# Patient Record
Sex: Female | Born: 1960 | Race: White | Hispanic: No | Marital: Married | Smoking: Former smoker
Health system: Southern US, Community
[De-identification: ages and names within clinical notes are randomized; demographics above are authoritative.]

## PROBLEM LIST (undated history)

## (undated) DIAGNOSIS — C50919 Malignant neoplasm of unspecified site of unspecified female breast: Secondary | ICD-10-CM

## (undated) DIAGNOSIS — H9390 Unspecified disorder of ear, unspecified ear: Secondary | ICD-10-CM

## (undated) DIAGNOSIS — I10 Essential (primary) hypertension: Secondary | ICD-10-CM

## (undated) DIAGNOSIS — Z923 Personal history of irradiation: Secondary | ICD-10-CM

## (undated) DIAGNOSIS — C801 Malignant (primary) neoplasm, unspecified: Secondary | ICD-10-CM

## (undated) HISTORY — DX: Unspecified disorder of ear, unspecified ear: H93.90

## (undated) HISTORY — PX: WISDOM TOOTH EXTRACTION: SHX21

## (undated) HISTORY — PX: BREAST CYST EXCISION: SHX579

## (undated) HISTORY — DX: Essential (primary) hypertension: I10

## (undated) HISTORY — PX: OTHER SURGICAL HISTORY: SHX169

## (undated) HISTORY — PX: BREAST BIOPSY: SHX20

---
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
## 1997-08-19 ENCOUNTER — Ambulatory Visit (HOSPITAL_COMMUNITY): Admission: RE | Admit: 1997-08-19 | Discharge: 1997-08-19 | Payer: Self-pay | Admitting: Obstetrics and Gynecology

## 1997-11-04 ENCOUNTER — Inpatient Hospital Stay (HOSPITAL_COMMUNITY): Admission: RE | Admit: 1997-11-04 | Discharge: 1997-11-04 | Payer: Self-pay | Admitting: Obstetrics and Gynecology

## 1998-01-05 ENCOUNTER — Inpatient Hospital Stay (HOSPITAL_COMMUNITY): Admission: AD | Admit: 1998-01-05 | Discharge: 1998-01-06 | Payer: Self-pay | Admitting: Obstetrics and Gynecology

## 1999-10-03 ENCOUNTER — Encounter: Payer: Self-pay | Admitting: Emergency Medicine

## 1999-10-03 ENCOUNTER — Emergency Department (HOSPITAL_COMMUNITY): Admission: EM | Admit: 1999-10-03 | Discharge: 1999-10-03 | Payer: Self-pay | Admitting: Emergency Medicine

## 1999-10-10 ENCOUNTER — Encounter: Payer: Self-pay | Admitting: Neurosurgery

## 1999-10-10 ENCOUNTER — Ambulatory Visit (HOSPITAL_COMMUNITY): Admission: RE | Admit: 1999-10-10 | Discharge: 1999-10-11 | Payer: Self-pay | Admitting: Neurosurgery

## 2000-02-26 ENCOUNTER — Other Ambulatory Visit: Admission: RE | Admit: 2000-02-26 | Discharge: 2000-02-26 | Payer: Self-pay | Admitting: Obstetrics and Gynecology

## 2004-07-19 ENCOUNTER — Other Ambulatory Visit: Admission: RE | Admit: 2004-07-19 | Discharge: 2004-07-19 | Payer: Self-pay | Admitting: Internal Medicine

## 2005-06-04 ENCOUNTER — Ambulatory Visit (HOSPITAL_COMMUNITY): Admission: RE | Admit: 2005-06-04 | Discharge: 2005-06-04 | Payer: Self-pay | Admitting: Internal Medicine

## 2005-06-17 ENCOUNTER — Encounter: Admission: RE | Admit: 2005-06-17 | Discharge: 2005-06-17 | Payer: Self-pay | Admitting: Internal Medicine

## 2005-08-22 ENCOUNTER — Other Ambulatory Visit: Admission: RE | Admit: 2005-08-22 | Discharge: 2005-08-22 | Payer: Self-pay | Admitting: Internal Medicine

## 2006-06-24 ENCOUNTER — Ambulatory Visit (HOSPITAL_COMMUNITY): Admission: RE | Admit: 2006-06-24 | Discharge: 2006-06-24 | Payer: Self-pay | Admitting: *Deleted

## 2006-09-03 ENCOUNTER — Other Ambulatory Visit: Admission: RE | Admit: 2006-09-03 | Discharge: 2006-09-03 | Payer: Self-pay | Admitting: Family Medicine

## 2007-08-05 ENCOUNTER — Ambulatory Visit (HOSPITAL_COMMUNITY): Admission: RE | Admit: 2007-08-05 | Discharge: 2007-08-05 | Payer: Self-pay | Admitting: *Deleted

## 2007-08-11 ENCOUNTER — Encounter: Admission: RE | Admit: 2007-08-11 | Discharge: 2007-08-11 | Payer: Self-pay | Admitting: *Deleted

## 2008-02-29 HISTORY — PX: OTHER SURGICAL HISTORY: SHX169

## 2008-03-01 ENCOUNTER — Ambulatory Visit (HOSPITAL_COMMUNITY): Admission: RE | Admit: 2008-03-01 | Discharge: 2008-03-01 | Payer: Self-pay | Admitting: Neurosurgery

## 2008-03-17 ENCOUNTER — Ambulatory Visit (HOSPITAL_BASED_OUTPATIENT_CLINIC_OR_DEPARTMENT_OTHER): Admission: RE | Admit: 2008-03-17 | Discharge: 2008-03-17 | Payer: Self-pay | Admitting: Orthopedic Surgery

## 2008-08-11 ENCOUNTER — Encounter: Admission: RE | Admit: 2008-08-11 | Discharge: 2008-08-11 | Payer: Self-pay | Admitting: Family Medicine

## 2008-11-29 ENCOUNTER — Encounter: Payer: Self-pay | Admitting: Family Medicine

## 2008-11-29 LAB — CONVERTED CEMR LAB
Cholesterol: 200 mg/dL
HDL: 63 mg/dL
LDL Cholesterol: 112 mg/dL

## 2008-12-29 ENCOUNTER — Ambulatory Visit: Payer: Self-pay | Admitting: Family Medicine

## 2008-12-29 ENCOUNTER — Other Ambulatory Visit: Admission: RE | Admit: 2008-12-29 | Discharge: 2008-12-29 | Payer: Self-pay | Admitting: Family Medicine

## 2008-12-30 ENCOUNTER — Encounter: Payer: Self-pay | Admitting: Family Medicine

## 2008-12-30 LAB — CONVERTED CEMR LAB
AST: 21 units/L (ref 0–37)
Albumin: 4.5 g/dL (ref 3.5–5.2)
BUN: 13 mg/dL (ref 6–23)
Calcium: 8.5 mg/dL (ref 8.4–10.5)
Chloride: 102 meq/L (ref 96–112)
Glucose, Bld: 97 mg/dL (ref 70–99)
Potassium: 4.2 meq/L (ref 3.5–5.3)
Sodium: 138 meq/L (ref 135–145)
Total Protein: 6.9 g/dL (ref 6.0–8.3)

## 2009-01-05 ENCOUNTER — Encounter: Payer: Self-pay | Admitting: Family Medicine

## 2009-01-05 ENCOUNTER — Telehealth: Payer: Self-pay | Admitting: Family Medicine

## 2009-01-05 LAB — CONVERTED CEMR LAB: Pap Smear: NORMAL

## 2009-12-10 LAB — LIPID PANEL: HDL: 58 mg/dL (ref 35–70)

## 2010-02-19 ENCOUNTER — Encounter: Payer: Self-pay | Admitting: *Deleted

## 2010-04-19 ENCOUNTER — Encounter: Payer: Self-pay | Admitting: Family Medicine

## 2010-04-26 ENCOUNTER — Ambulatory Visit (INDEPENDENT_AMBULATORY_CARE_PROVIDER_SITE_OTHER): Payer: 59 | Admitting: Family Medicine

## 2010-04-26 VITALS — BP 159/90 | HR 65 | Ht 61.5 in | Wt 139.0 lb

## 2010-04-26 DIAGNOSIS — L089 Local infection of the skin and subcutaneous tissue, unspecified: Secondary | ICD-10-CM

## 2010-04-26 DIAGNOSIS — L723 Sebaceous cyst: Secondary | ICD-10-CM

## 2010-04-26 NOTE — Patient Instructions (Signed)
Call if any signs of redenss Follow up with your dermatologist.  Remove bandage tomorrow and clean in the shower.  Apply vaseline and bandaid until heals.

## 2010-04-26 NOTE — Progress Notes (Signed)
  Subjective:    Patient ID: Catherine Monroe, female    DOB: 1960-03-19, 50 y.o.   MRN: [id number]  HPI Cyst there for a long time. Was getting a massage a couple of weeks ago and then it broke and some discharge. .  Then when went home and tried to squeeze it some.  No fever. It is now tender.    Review of Systems     Objective:   Physical Exam  Constitutional: She appears well-nourished.  Skin:          Some bruising over the area.  + yellow discharge.            Assessment & Plan:  Infected seb cyst. - Lesion lanced and large amount of debris expressed.  Discussed she will need full excision of the seb cyst.  She will call her dermatologist for this.  Hold off on ABX since no surrounding cellulitis.

## 2010-04-30 ENCOUNTER — Encounter: Payer: Self-pay | Admitting: Family Medicine

## 2010-05-15 LAB — POCT HEMOGLOBIN-HEMACUE: Hemoglobin: 13.2 g/dL (ref 12.0–15.0)

## 2010-06-07 ENCOUNTER — Encounter: Payer: Self-pay | Admitting: Family Medicine

## 2010-06-12 NOTE — Op Note (Signed)
Catherine Monroe, LEHRMAN                 ACCOUNT NO.:  [id number]   MEDICAL RECORD NO.:  [id number]          PATIENT TYPE:  AMB   LOCATION:  DSC                          FACILITY:  [facility]   PHYSICIAN:  Robert A. Thurston Hole, M.D. DATE OF BIRTH:  03-Mar-1960   DATE OF PROCEDURE:  03/17/2008  DATE OF DISCHARGE:                               OPERATIVE REPORT   PREOPERATIVE DIAGNOSIS:  Right knee lateral meniscus tear.   POSTOPERATIVE DIAGNOSIS:  Right knee lateral meniscus tear.   PROCEDURE:  Right knee examination under anesthesia followed by  arthroscopic partial lateral meniscectomy.   SURGEON:  Elana Alm. Thurston Hole, MD   ANESTHESIA:  General.   OPERATIVE TIME:  30 minutes.   COMPLICATIONS:  None.   INDICATIONS FOR PROCEDURE:  Ms. Loeffler is a 50 year old woman who has had  6 months of increasing right knee pain with exam under MRI documenting  lateral meniscus tear.  She has failed conservative care and is now to  undergo arthroscopy.   DESCRIPTION:  Ms. Fogleman was brought to the operating room on March 17, 2008, and placed on operative table in supine position.  After  adequate level of general anesthesia was obtained, her right knee was  examined.  She had full range of motion of knee, stable ligamentous exam  with normal patellar tracking.  The knee was sterilely injected with  0.25% Marcaine with epinephrine.  The right leg was prepped using  sterile DuraPrep and draped using sterile technique.  Originally through  an anterolateral portal, the arthroscope with a pump was attached was  placed and through an anteromedial portal, an arthroscopic probe was  placed.  On initial inspection of medial compartment, the articular  cartilage was normal.  Medial meniscus was normal.  Intercondylar notch  was inspected.  Anterior and posterior cruciate ligaments were normal.  Lateral compartment articular cartilage was normal.  Lateral meniscus  showed a split tear of the posterolateral corner  30-40%, which was  resected back to a stable rim.  Patellofemoral joint articular cartilage  was normal.  The patella tracked normally.  Medial and lateral gutters  showed moderate synovitis, which was debrided, otherwise this was free  of pathology.  After this was done, it was felt that all pathology has  been satisfactorily addressed.  The instruments were removed.  Portals  were closed with 3-0 nylon suture and injected with 0.25% Marcaine with  epinephrine and 4 mg morphine.  Knee was then dressed sterilely.  The  patient was then awakened, extubated, and taken to recovery in stable  condition.  Needle and sponge counts were correct x2 at end of the case.   FOLLOWUP CARE:  Ms. Mazzarella will be followed as an outpatient on Vicodin  and Mobic.  She will be seen back in the office in a week for sutures  out and followup.      Robert A. Thurston Hole, M.D.  Electronically Signed     RAW/MEDQ  D:  03/17/2008  T:  03/18/2008  Job:  [id number]

## 2010-06-15 NOTE — H&P (Signed)
[place]  Patient:    Catherine Monroe, Catherine Monroe                        MRN: [id number] Adm. Date:  [id number] Attending:  Josie Monroe                         History and Physical  REASON FOR ADMISSION:  Herniated cervical disc.  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  She presented there on the 5th of September with severe left arm pain and weakness and they performed a MRI of her cervical spine which demonstrated a C6/7 lateral disc herniation.  She has a smaller central disc herniation and spondylitic disease at C4/5 and to a lesser degree at the C5/6 level.  She says that she has had episodic left-sided neck and arm pain but that on the 3rd of September she awoke with severe left arm pain and numbness which has been increasingly problematic for her.  She notes weakness in her left arm and she notes that she is losing control of her left arm.  She notes a little numbness in her left hand.  She denies any right upper extremity complaints or any bowel or bladder dysfunction or any lower extremity complaints.  Catherine Monroe works out regularly.  She lifts weight and she said that when she was lifting weights about a month ago she developed pain into her left arm and subsequently she has had intermittent left arm and neck pain.  She works on a float basis as a Engineer, civil (consulting).  She has two children, age 45 years and 21 months.  REVIEW OF SYSTEMS:  She was reviewed as a patient.  Pertinent positives that are under EAR/NOSE/MOUTH/THROAT:  She notes chronic hearing loss. MUSCULOSKELETAL:  She notes are weakness, back pain, arm pain, and neck pain. All other systems are negative.  PAST MEDICAL HISTORY:  Prior operations and hospitalization are significant for in 1972 she had a radical mastoidectomy in the right ear.  In 1980,  she had a benign cyst removed from her left breast.  In 1980, she had a pilonidal cyst removed.  She has no other medical problems.  MEDICATIONS:  Naprosyn q.12h. for neck pain.  ALLERGIES:  She is allergic to PENICILLIN.  HEIGHT/WEIGHT:  She is 5 feet 1 inch tall and 121 pounds.  FAMILY HISTORY:  Her mother is 104 and in good health.  Father is 92 in good health.  There is a history of hypertension in her family.  SOCIAL HISTORY:  Catherine Monroe is a nonsmoker, nondrinker.  No history of substance abuse.  She has had no recent weight gain or loss.  She is married. She has two children at home and she works as a Engineer, civil (consulting).  LABORATORY DATA:  Diagnostic studies as above.  PHYSICAL EXAMINATION:  GENERAL:  On examination today, Catherine Monroe is a very uncomfortable-appearing white female in no acute distress.  HEENT:  Normocephalic and atraumatic.  Pupils are equal, round and reactive to light.  Extraocular muscles are intact.  Sclerae white.  Conjunctivae pink. Oropharynx benign.  Uvula midline.  NECK:  There are no masses, meningismus, deformity, tracheal deviation, jugular venous distension, or carotid bruits.  She is limited.  MUSCULOSKELETAL:  Cervical range of motion is significantly positive Spurlings  maneuver to the left with reproduction of radicular pain turning her head to the left and negative on the right.  With axial compression, she notes shooting pain to her left arm but otherwise negative.  She has limited range of motion both in forward bending and has reproduction of radicular pain with extension.  LUNGS:  There is normal respiratory effort with good intercostal function. Lungs are clear to auscultation.  There are no rales, rhonchi, or wheezes.  CARDIOVASCULAR:  Regular rate and rhythm to auscultation.  No murmurs are appreciated.  EXTREMITIES:  There is no extremity edema, clubbing, or cyanosis.  There are palpable pedal pulses.  ABDOMEN:  Soft, nontender.  No  hepatosplenomegaly appreciated or masses. There are active bowel sounds.  No guarding or rebound.  NEUROLOGICAL:  The patient is able to walk about the examining room with a normal heel, toe, and casual gait.  The patient is oriented to time, person, and place.  She has good recall of both recent and remote memory with normal attention span and concentration.  The patient speaks a clear and fluent speech and exhibits normal language function and appropriate fund of knowledge.  Cranial nerve examination shows the pupils are equal, round and reactive to light.  Extraocular movements are full.  Visual fields are full to confrontational testing.  Facial sensation and facial motor are intact and symmetric.  Hearing is intact to finger rub.  Palate is upgoing.  Shoulder shrug is symmetric.  Tongue protrudes in the midline.  Motor examination shows the motor strength is 5/5 and bilateral deltoids, biceps, triceps, hand grips, wrist extensor, interossei with the exception of 3+/4- out of 5 left triceps strength and 4-/5 left wrist flexion strength.  In the lower extremities, motor strength is 5/5 and bilateral.  Hip flexion and extension, quadriceps, hamstrings, plantar flexion, dorsi flexion, and extensor hallucis longus. Sensory examination shows she has mild hypesthesia to pinprick in the C7 distribution on the left.  Deep tendon reflexes are 2 in the upper extremities, in the biceps, and the brachial radialis bilaterally symmetric. They are 2 at the right triceps.  Trace at the left triceps.  Reflexes are 2 at the knees, 2 at the ankles.  Great toes are downgoing to plantar stimulation.  Cerebellar examination is normal.  Coordination in the upper and lower extremities is normal to rapid alternating movement.  Romberg test is negative.  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 She  has spondylitic disease and a central disc protrusion at C4/5 and to a lesser degree at the C5/6 level.  RECOMMENDATIONS:  I have recommended to her based on the severity of her pain  as well as significant weakness that she undergo a cervical discectomy.  I told her I felt this could be done as either an anterior cervical effusion or preferably because of the lateral location of the herniated disc that this could be done as a posterior discectomy.  I have recommended that this be done using the metric system so that this will facilitate minimal exposure.  This will be done with fluoroscopy.  I explained to her the risks of surgery including the possibility of venous embolism and the need for central catheter during the surgery.  She understands the risks which include bleeding, infection, damage to nerves and vessels, weakness, paralysis, recurrent disc herniation, need for further surgery at a later date, failure  to relieve all of her pain or weakness, or persistent arm pain, numbness, or reflexic sympathetic disc disease.  She wishes to go ahead with surgery.  This is set up for October 10, 1999. DD:  10/10/99 TD:  10/10/99 Job: [id number]

## 2010-06-15 NOTE — Op Note (Signed)
[place]. [facility]  Patient:    Catherine, Monroe                        MRN: [id number] Proc. Date: 10/10/99 Adm. Date:  [id number] Disc. 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  ADDITIONAL DIAGNOSES:  Multilevel cervical spondylosis, central disk herniations at the C4-5 and C5-6 level.  POSTOPERATIVE DIAGNOSIS:  Herniated cervical disk C6-7, left with left C7 radiculopathy.  ADDITIONAL DIAGNOSES:  Multilevel cervical spondylosis, central disk herniations at the C4-5 and C5-6 level.  OPERATION PERFORMED:  Left C6-7 microendoscopic diskectomy.  SURGEON:  Danae Orleans. Venetia Maxon, M.D.  ASSISTANT:  Stefani Dama, M.D.  ANESTHESIA:  General endotracheal.  ESTIMATED BLOOD LOSS:  Minimal.  COMPLICATIONS:  None.  DISPOSITION:  Recovery.  INDICATIONS FOR PROCEDURE:  Catherine Monroe is a 50 year old woman with an acute C7 radiculopathy on the left with severe left triceps weakness.  She was found to have a laterally based disk herniation at the C6-7 level compressing the left C-7 nerve root.  She was also found to have cervical spondylitic disease and central disk herniations at the C4-5 and C5-6 levels.  Consequently it was elected to perform a posterior single level microendoscopic diskectomy rather than multilevel anterior cervical diskectomy.  DESCRIPTION OF PROCEDURE:  Catherine Monroe was brought to the operating room. Following the placement of the central venous catheter and smooth and uncomplicated induction of general endotracheal anesthesia, she was placed in a supine position.  She was placed into three-pin Mayfield head fixation.  She was placed in a sitting position.  Her fluoroscopy was utilized throughout the surgery to confirm localization and positioning of instrumentation.  The posterior neck was then prepped and  draped in the usual sterile fashion.  The area of planned incision was infiltrated with 0.25% Marcaine and 0.5% lidocaine 1:200,000 epinephrine.  Incision was made overlying the radiographically confirmed postioning of a K-wire just a fingerbreadth lateral on the left of the C6-7 interspace.  The K-wire was inserted to contact the C6 lamina.  The sequential dilators were then used after enlarging the incision to approximately 2 cm in length and using a #4 endoscope tube, the self-retaining retractor system was hooked up.  Positioning of the endoscope tube was confirmed under fluoroscopic visualization.  The ____________ system was used to facilitate exposure and endoscopic diskectomy.  The microscope was then brought into the field and using microdissection technique, the muscle overlying the C6-7 laminar interspace was cauterized and removed with pituitary rongeur.  The Midas Rex drill with A-2 bur was then utilized to perform a small semihemilaminectomy of C6 and this was completed with 2 mm gold tipped Kerrison rongeur.  The superior portion of the C7 lamina was also removed with Kerrison rongeur.  The ligamentum flavum was detached from the dura and the ligamentum flavum was detached from the dura and the ligamentum flavum was then removed in piecemeal fashion exposing the lateral edge of the spinal cord and epidural fat.  A large disk herniation was identified.  The C7 nerve root appeared to be pushed cephalad by the herniated disk material.  The disk herniation was then incised with a 15 blade and disk material was removed in a piecemeal  fashion.  This resulted in decompression of the C7 nerve root and lateral aspect of the spinal cord.  Hemostasis was assured with Gelfoam soaked in thrombin.  Final x-ray confirmed positioning at the appropriate level within the disk space.  The endoscope was removed.  The microscope was taken out of the field.  The wound was copiously irrigated with  bacitracin saline.  The deep muscle layer was reapproximated with a single 2-0 Vicryl stitch and the skin edges reapproximated with interrupted 3-0 Vicryl subcuticular stitch.  The wound was dressed with benzoin and Steri-Strips, Telfa and Tegaderm.  The patient was removed from three-pin fixation, extubated in the operating room, taken to  recovery room in stable and satisfactory condition having tolerated the operation well.  Counts were correct at the end of the case. DD:  10/10/99 TD:  10/11/99 Job: [id number]

## 2011-03-13 ENCOUNTER — Encounter: Payer: Self-pay | Admitting: *Deleted

## 2011-03-20 ENCOUNTER — Other Ambulatory Visit (HOSPITAL_COMMUNITY)
Admission: RE | Admit: 2011-03-20 | Discharge: 2011-03-20 | Disposition: A | Discharge status: Home / Self Care | Payer: 59 | Source: Ambulatory Visit | Attending: Family Medicine | Admitting: Family Medicine

## 2011-03-20 ENCOUNTER — Encounter: Payer: Self-pay | Admitting: Family Medicine

## 2011-03-20 ENCOUNTER — Ambulatory Visit (INDEPENDENT_AMBULATORY_CARE_PROVIDER_SITE_OTHER): Payer: 59 | Admitting: Family Medicine

## 2011-03-20 VITALS — BP 141/77 | HR 102 | Ht 61.5 in | Wt 130.0 lb

## 2011-03-20 DIAGNOSIS — Z01419 Encounter for gynecological examination (general) (routine) without abnormal findings: Secondary | ICD-10-CM

## 2011-03-20 DIAGNOSIS — Z23 Encounter for immunization: Secondary | ICD-10-CM

## 2011-03-20 NOTE — Patient Instructions (Signed)
Start a regular exercise program and make sure you are eating a healthy diet Try to eat 4 servings of dairy a day or take a calcium supplement (500mg twice a day). Your vaccines are up to date.   

## 2011-03-20 NOTE — Progress Notes (Signed)
  Subjective:     Catherine Monroe is a 51 y.o. female and is here for a comprehensive physical exam. The patient reports problems - Not regular periods.  Skipped last period. Marland Kitchen  History   Social History  . Marital Status: Married    Spouse Name: N/A    Number of Children: 2  . Years of Education: N/A   Occupational History  . Not on file.   Social History Main Topics  . Smoking status: Former Smoker    Types: Cigarettes    Quit date: 01/29/1987  . Smokeless tobacco: Not on file  . Alcohol Use: Yes  . Drug Use: No  . Sexually Active: Yes -- Female partner(s)   Other Topics Concern  . Not on file   Social History Narrative   REgular exercise.    Health Maintenance  Topic Date Due  . Influenza Vaccine  10/29/2011  . Pap Smear  03/19/2014  . Tetanus/tdap  03/19/2021    The following portions of the patient's history were reviewed and updated as appropriate: allergies, current medications, past family history, past medical history, past social history, past surgical history and problem list.  Review of Systems A comprehensive review of systems was negative.   Objective:    BP 141/77  Pulse 102  Ht 5' 1.5" (1.562 m)  Wt 130 lb (58.968 kg)  BMI 24.17 kg/m2  LMP 02/23/2011 General appearance: alert, cooperative and appears stated age Head: Normocephalic, without obvious abnormality, atraumatic Eyes: PERRLA, EOMi.conjunctiva clear.   Ears: normal TM's and external ear canals both ears Nose: Nares normal. Septum midline. Mucosa normal. No drainage or sinus tenderness. Throat: lips, mucosa, and tongue normal; teeth and gums normal Neck: no adenopathy, no carotid bruit, no JVD, supple, symmetrical, trachea midline and thyroid not enlarged, symmetric, no tenderness/mass/nodules Back: symmetric, no curvature. ROM normal. No CVA tenderness. Lungs: clear to auscultation bilaterally Breasts: normal appearance, no masses or tenderness Heart: regular rate and rhythm, S1, S2 normal,  no murmur, click, rub or gallop Abdomen: soft, non-tender; bowel sounds normal; no masses,  no organomegaly Pelvic: cervix normal in appearance, external genitalia normal, no adnexal masses or tenderness, no cervical motion tenderness, rectovaginal septum normal, uterus normal size, shape, and consistency and vagina normal without discharge Extremities: extremities normal, atraumatic, no cyanosis or edema Pulses: 2+ and symmetric Skin: Skin color, texture, turgor normal. No rashes or lesions Lymph nodes: Cervical, supraclavicular, and axillary nodes normal. Neurologic: Grossly normal    Assessment:    Healthy female exam. CPE annual exam     Plan:     See After Visit Summary for Counseling Recommendations  Start a regular exercise program and make sure you are eating a healthy diet Try to eat 4 servings of dairy a day or take a calcium supplement (500mg  twice a day). Your vaccines are up to date.  Given tdap today.   Fasting labs slip given today.  Call if periods more irregular Had discussion about mammo.  She wants to hold off until age 21.  She has really done a great job with her weight loss and her blood pressure is actually improved significantly today.

## 2011-03-21 LAB — COMPLETE METABOLIC PANEL WITH GFR
ALT: 20 U/L (ref 0–35)
AST: 19 U/L (ref 0–37)
Alkaline Phosphatase: 52 U/L (ref 39–117)
BUN: 14 mg/dL (ref 6–23)
Glucose, Bld: 90 mg/dL (ref 70–99)
Potassium: 4.2 mEq/L (ref 3.5–5.3)
Total Bilirubin: 1 mg/dL (ref 0.3–1.2)
Total Protein: 6.4 g/dL (ref 6.0–8.3)

## 2011-03-21 LAB — LIPID PANEL
LDL Cholesterol: 79 mg/dL (ref 0–99)
Triglycerides: 79 mg/dL (ref <150)

## 2011-03-21 NOTE — Progress Notes (Signed)
Quick Note:  All labs are normal. ______ 

## 2011-06-27 ENCOUNTER — Emergency Department
Admission: EM | Admit: 2011-06-27 | Discharge: 2011-06-27 | Disposition: A | Discharge status: Home / Self Care | Payer: 59 | Source: Home / Self Care | Attending: Emergency Medicine | Admitting: Emergency Medicine

## 2011-06-27 ENCOUNTER — Encounter: Payer: Self-pay | Admitting: Emergency Medicine

## 2011-06-27 ENCOUNTER — Emergency Department: Admit: 2011-06-27 | Discharge: 2011-06-27 | Disposition: A | Discharge status: Home / Self Care | Payer: 59

## 2011-06-27 DIAGNOSIS — M79609 Pain in unspecified limb: Secondary | ICD-10-CM

## 2011-06-27 DIAGNOSIS — M79672 Pain in left foot: Secondary | ICD-10-CM

## 2011-06-27 NOTE — ED Notes (Signed)
Right foot injury 10 days ago reinjured today

## 2011-06-27 NOTE — ED Provider Notes (Signed)
History     CSN: [id number]  Arrival date & time 06/27/11  1637   First MD Initiated Contact with Patient 06/27/11 1706      Chief Complaint  Patient presents with  . Foot Injury    (Consider location/radiation/quality/duration/timing/severity/associated sxs/prior treatment) HPI She injured her foot about 10 days ago with an inversion injury.  It was painful the time and then started to get better.  She was going down the stairs today and slipped and re-nverted her left ankle and reinjured it.  She states there is some swelling and bruising.  She is mostly tender on the outside of her foot but not her ankle.  No previous injuries other than last week and no history of any fractures.  She states the pain is sore and sharp constant, worse with certain movements and with walking.  She has not been using any medications or modalities.  Past Medical History  Diagnosis Date  . 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 Right knee scope for torn meniscus 2/10  . Mastoidectomy 1970    Family History  Problem Relation Age of Onset  . Hypertension Mother   . Hypertension Father   . Stroke Father   . Prostate cancer Father     History  Substance Use Topics  . Smoking status: Former Smoker    Types: Cigarettes    Quit date: 01/29/1987  . Smokeless tobacco: Not on file  . Alcohol Use: Yes    OB History    Grav Para Term Preterm Abortions TAB SAB Ect Mult Living                  Review of Systems  All other systems reviewed and are negative.    Allergies  Penicillins  Home Medications   Current Outpatient Rx  Name Route Sig Dispense Refill  . IBUPROFEN 200 MG PO TABS Oral Take 200 mg by mouth daily as needed. For knee pain     . MAGNESIUM 100 MG PO CAPS Oral Take by mouth.      . MELATONIN 2.5 MG PO CAPS Oral Take by mouth at  bedtime.        BP 135/84  Pulse 74  Temp(Src) 98.2 F (36.8 C) (Oral)  Resp 12  Ht 5' 1.5" (1.562 m)  Wt 133 lb (60.328 kg)  BMI 24.72 kg/m2  SpO2 97%  Physical Exam  Nursing note and vitals reviewed. Constitutional: She is oriented to person, place, and time. She appears well-developed and well-nourished.  HENT:  Head: Normocephalic and atraumatic.  Eyes: No scleral icterus.  Neck: Neck supple.  Cardiovascular: Regular rhythm and normal heart sounds.   Pulmonary/Chest: Effort normal and breath sounds normal. No respiratory distress.  Musculoskeletal:       L ankle/foot: FROM, +TTP 4th and 5th metatarsals.   No TTP medial/lateral malleolus, navicular, calcaneus, Achilles, proximal fibula.  +ecchymoses distally in middle toes and mild swelling lateral foot.  Distal neurovascular status is intact.    Neurological: She is alert and oriented to person, place, and time.  Skin: Skin is warm and dry.  Psychiatric: She has a normal mood and affect. Her speech is normal.    ED Course  Procedures (including critical care time)  Labs Reviewed - No data to display Dg Foot Complete Left  06/27/2011  *RADIOLOGY REPORT*  Clinical Data: Left foot pain since a fall 10 days ago.  LEFT FOOT - COMPLETE 3+ VIEW  Comparison: None.  Findings: Elongated, oblique fracture of the fifth metatarsal shaft with associated mild soft tissue swelling.  No significant displacement or angulation.  IMPRESSION: Fifth metatarsal fracture, as described above.  Original Report Authenticated By: Darrol Angel, M.D.     1. Left foot pain       MDM  An x-ray was obtained and read by the radiologist as above. Encourage rest, ice, compression with ACE bandage and/or a brace, and elevation of injured body part.  The role of anti-inflammatories is discussed with the patient.  We gave her a walking boot.  Since she is not in significant pain, I believe that the walking boot can take pressure off of the bone enough  and that she should not need any crutches.  We have referred her to sports medicine for followup evaluation and followup x-ray.  Marlaine Hind, MD 06/27/11 1754

## 2011-07-11 ENCOUNTER — Encounter: Payer: Self-pay | Admitting: Family Medicine

## 2011-07-11 ENCOUNTER — Ambulatory Visit (INDEPENDENT_AMBULATORY_CARE_PROVIDER_SITE_OTHER): Payer: 59 | Admitting: Family Medicine

## 2011-07-11 ENCOUNTER — Ambulatory Visit (HOSPITAL_BASED_OUTPATIENT_CLINIC_OR_DEPARTMENT_OTHER)
Admission: RE | Admit: 2011-07-11 | Discharge: 2011-07-11 | Disposition: A | Discharge status: Home / Self Care | Payer: 59 | Source: Ambulatory Visit | Attending: Family Medicine | Admitting: Family Medicine

## 2011-07-11 VITALS — BP 136/88 | HR 67 | Temp 98.5°F | Ht 62.0 in | Wt 130.0 lb

## 2011-07-11 DIAGNOSIS — X58XXXA Exposure to other specified factors, initial encounter: Secondary | ICD-10-CM | POA: Insufficient documentation

## 2011-07-11 DIAGNOSIS — S92309A Fracture of unspecified metatarsal bone(s), unspecified foot, initial encounter for closed fracture: Secondary | ICD-10-CM

## 2011-07-11 DIAGNOSIS — S92355A Nondisplaced fracture of fifth metatarsal bone, left foot, initial encounter for closed fracture: Secondary | ICD-10-CM

## 2011-07-11 DIAGNOSIS — S92353A Displaced fracture of fifth metatarsal bone, unspecified foot, initial encounter for closed fracture: Secondary | ICD-10-CM

## 2011-07-11 NOTE — Progress Notes (Signed)
Subjective:    Patient ID: Catherine Monroe, female    DOB: July 24, 1960, 51 y.o.   MRN: [id number]  PCP: Dr. Linford Arnold  HPI 51 yo F here for left foot fracture.  Patient reports on 5/19 she was coming over a baby gate when she fell to her left side. Some pain in left foot though not excruciating - was achy and improving. Then a bug scared her on 5/30 - she jumped back, put all weight on left foot and felt severe pain in lateral left foot. Some swelling and bruising. Difficulty bearing weight after this. Went to urgent care that day - had x-rays showing a 5th metatarsal shaft fracture - placed in short cam walker and referred here. Hurts if she puts any weight on left foot especially without cam walker. Not taking any medications and has stopped icing. Used motrin the first night.   Past Medical History  Diagnosis Date  . 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 ibuprofen (ADVIL,MOTRIN) 200 MG tablet Take 200 mg by mouth daily as needed. For knee pain       . Magnesium 100 MG CAPS Take by mouth.        . Melatonin 2.5 MG CAPS Take by mouth at bedtime.          Past Surgical History  Procedure Date  . Right knee scope for torn meniscus 2/10  . Mastoidectomy 1970    Allergies  Allergen Reactions  . Penicillins     REACTION: hives and itching    History   Social History  . Marital Status: Married    Spouse Name: N/A    Number of Children: 2  . Years of Education: N/A   Occupational History  . Not on file.   Social History Main Topics  . Smoking status: Former Smoker    Types: Cigarettes    Quit date: 01/29/1987  . Smokeless tobacco: Not on file  . Alcohol Use: Yes  . Drug Use: No  . Sexually Active: Yes -- Female partner(s)   Other Topics Concern  . Not on file   Social History  Narrative   REgular exercise.     Family History  Problem Relation Age of Onset  . Hypertension Mother   . Hypertension Father   . Stroke Father   . Prostate cancer Father   . Sudden death Neg Hx   . Hyperlipidemia Neg Hx   . Heart attack Neg Hx   . Diabetes Neg Hx     BP 136/88  Pulse 67  Temp 98.5 F (36.9 C) (Oral)  Ht 5\' 2"  (1.575 m)  Wt 130 lb (58.968 kg)  BMI 23.78 kg/m2  Review of Systems See HPI above.    Objective:   Physical Exam Gen: NAD  L foot/ankle: Mild swelling but no bruising left foot laterally.  No other gross deformity, swelling, ecchymoses FROM ankle.  Mild pain with ext rotation. TTP 5th > 4th metatarsal.  No other TTP about foot or ankle. Negative ant drawer and talar tilt.   Negative syndesmotic compression. Thompsons test negative. NV intact distally.    Assessment & Plan:  1. Left oblique 5th metatarsal fracture - x-rays show questionable interval healing though fracture has not displaced further.  Advised patient that  in order to give this the best opportunity to heal with conservative care, recommend non-weight bearing given it's a shaft fracture of the 5th metatarsal.  Continue wearing the short cam walker for protection.  Elevation, icing, tylenol as needed.  Crutches to help with ambulation.  F/u in 2 weeks for reevaluation, repeat x-rays.

## 2011-07-11 NOTE — Patient Instructions (Addendum)
Your x-rays show possible early healing of this fracture. Continue wearing the short cam boot as much as possible. I would recommend not weight bearing with a fracture of this bone in this location to give you the best chance for healing. This means either crutches or a knee scooter. Elevate above the level of your heart when possible. Tylenol as needed for pain. Follow up with me in 2 weeks for reevaluation, repeat x-rays. Typically you're looking at 6-8 weeks of treatment before returning to regular activities assuming this heals as expected.

## 2011-07-13 ENCOUNTER — Encounter: Payer: Self-pay | Admitting: Family Medicine

## 2011-07-13 DIAGNOSIS — S92355A Nondisplaced fracture of fifth metatarsal bone, left foot, initial encounter for closed fracture: Secondary | ICD-10-CM | POA: Insufficient documentation

## 2011-07-13 NOTE — Assessment & Plan Note (Signed)
Left oblique 5th metatarsal fracture - x-rays show questionable interval healing though fracture has not displaced further.  Advised patient that in order to give this the best opportunity to heal with conservative care, recommend non-weight bearing given it's a shaft fracture of the 5th metatarsal.  Continue wearing the short cam walker for protection.  Elevation, icing, tylenol as needed.  Crutches to help with ambulation.  F/u in 2 weeks for reevaluation, repeat x-rays.

## 2011-07-25 ENCOUNTER — Encounter: Payer: Self-pay | Admitting: Family Medicine

## 2011-07-25 ENCOUNTER — Ambulatory Visit (HOSPITAL_BASED_OUTPATIENT_CLINIC_OR_DEPARTMENT_OTHER)
Admission: RE | Admit: 2011-07-25 | Discharge: 2011-07-25 | Disposition: A | Discharge status: Home / Self Care | Payer: 59 | Source: Ambulatory Visit | Attending: Family Medicine | Admitting: Family Medicine

## 2011-07-25 ENCOUNTER — Ambulatory Visit (INDEPENDENT_AMBULATORY_CARE_PROVIDER_SITE_OTHER): Payer: 59 | Admitting: Family Medicine

## 2011-07-25 VITALS — BP 133/90 | HR 81 | Temp 98.1°F | Ht 62.0 in | Wt 129.0 lb

## 2011-07-25 DIAGNOSIS — S92353A Displaced fracture of fifth metatarsal bone, unspecified foot, initial encounter for closed fracture: Secondary | ICD-10-CM

## 2011-07-25 DIAGNOSIS — X58XXXA Exposure to other specified factors, initial encounter: Secondary | ICD-10-CM | POA: Insufficient documentation

## 2011-07-25 DIAGNOSIS — S92355A Nondisplaced fracture of fifth metatarsal bone, left foot, initial encounter for closed fracture: Secondary | ICD-10-CM

## 2011-07-25 DIAGNOSIS — S92309A Fracture of unspecified metatarsal bone(s), unspecified foot, initial encounter for closed fracture: Secondary | ICD-10-CM

## 2011-07-25 NOTE — Assessment & Plan Note (Signed)
Left oblique 5th metatarsal fracture - x-rays appear similar to those 2 weeks ago but compared to initial radiographs appears to be healing without large callus formation.  No further displacement.  Clinically she has improved as well.  Advised to continue non-weight bearing and wearing cam walker.  Will f/u in 2 weeks for repeat radiographs.  Again discussed risks of nonunion with this type of fracture but would wait until she had gone 6 weeks non-weight bearing and then if not healing, refer to orthopedist to consider ORIF vs bone stimulator.

## 2011-07-25 NOTE — Progress Notes (Signed)
Subjective:    Patient ID: Catherine Monroe, female    DOB: 1960/02/12, 51 y.o.   MRN: [id number]  PCP: Dr. Linford Arnold  HPI  51 yo F here for f/u left foot fracture.  6/13: Patient reports on 5/19 she was coming over a baby gate when she fell to her left side. Some pain in left foot though not excruciating - was achy and improving. Then a bug scared her on 5/30 - she jumped back, put all weight on left foot and felt severe pain in lateral left foot. Some swelling and bruising. Difficulty bearing weight after this. Went to urgent care that day - had x-rays showing a 5th metatarsal shaft fracture - placed in short cam walker and referred here. Hurts if she puts any weight on left foot especially without cam walker. Not taking any medications and has stopped icing. Used motrin the first night.  6/27: Patient has been non-weight bearing since visit 2 weeks ago with crutches States pain has improved quite a bit. Using cam walker also. Sleeping in boot also. Not taking anything for pain. Elevating.  Past Medical History  Diagnosis Date  . 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 ibuprofen (ADVIL,MOTRIN) 200 MG tablet Take 200 mg by mouth daily as needed. For knee pain       . Magnesium 100 MG CAPS Take by mouth.        . Melatonin 2.5 MG CAPS Take by mouth at bedtime.          Past Surgical History  Procedure Date  . Right knee scope for torn meniscus 2/10  . Mastoidectomy 1970    Allergies  Allergen Reactions  . Penicillins     REACTION: hives and itching    History   Social History  . Marital Status: Married    Spouse Name: N/A    Number of Children: 2  . Years of Education: N/A   Occupational History  . Not on file.   Social History Main Topics  . Smoking status: Former Smoker      Types: Cigarettes    Quit date: 01/29/1987  . Smokeless tobacco: Not on file  . Alcohol Use: Yes  . Drug Use: No  . Sexually Active: Yes -- Female partner(s)   Other Topics Concern  . Not on file   Social History Narrative   REgular exercise.     Family History  Problem Relation Age of Onset  . Hypertension Mother   . Hypertension Father   . Stroke Father   . Prostate cancer Father   . Sudden death Neg Hx   . Hyperlipidemia Neg Hx   . Heart attack Neg Hx   . Diabetes Neg Hx     BP 133/90  Pulse 81  Temp 98.1 F (36.7 C) (Oral)  Ht 5\' 2"  (1.575 m)  Wt 129 lb (58.514 kg)  BMI 23.59 kg/m2  LMP 07/16/2011  Review of Systems  See HPI above.    Objective:   Physical Exam  Gen: NAD  L foot/ankle: Mild swelling but no bruising left foot laterally.  No other gross deformity, swelling, ecchymoses FROM ankle. Mild TTP 5th metatarsal.  No other TTP about foot or ankle. Negative ant drawer and talar tilt.   Negative syndesmotic  compression. Thompsons test negative. NV intact distally.    Assessment & Plan:  1. Left oblique 5th metatarsal fracture - x-rays appear similar to those 2 weeks ago but compared to initial radiographs appears to be healing without large callus formation.  No further displacement.  Clinically she has improved as well.  Advised to continue non-weight bearing and wearing cam walker.  Will f/u in 2 weeks for repeat radiographs.  Again discussed risks of nonunion with this type of fracture but would wait until she had gone 6 weeks non-weight bearing and then if not healing, refer to orthopedist to consider ORIF vs bone stimulator.

## 2011-08-08 ENCOUNTER — Ambulatory Visit (INDEPENDENT_AMBULATORY_CARE_PROVIDER_SITE_OTHER): Payer: 59 | Admitting: Family Medicine

## 2011-08-08 ENCOUNTER — Ambulatory Visit (HOSPITAL_BASED_OUTPATIENT_CLINIC_OR_DEPARTMENT_OTHER)
Admission: RE | Admit: 2011-08-08 | Discharge: 2011-08-08 | Disposition: A | Discharge status: Home / Self Care | Payer: 59 | Source: Ambulatory Visit | Attending: Family Medicine | Admitting: Family Medicine

## 2011-08-08 VITALS — BP 139/93 | HR 73 | Temp 98.1°F | Ht 62.0 in | Wt 130.0 lb

## 2011-08-08 DIAGNOSIS — S92309A Fracture of unspecified metatarsal bone(s), unspecified foot, initial encounter for closed fracture: Secondary | ICD-10-CM | POA: Insufficient documentation

## 2011-08-08 DIAGNOSIS — Z09 Encounter for follow-up examination after completed treatment for conditions other than malignant neoplasm: Secondary | ICD-10-CM | POA: Insufficient documentation

## 2011-08-08 DIAGNOSIS — S92355A Nondisplaced fracture of fifth metatarsal bone, left foot, initial encounter for closed fracture: Secondary | ICD-10-CM

## 2011-08-08 DIAGNOSIS — S92353A Displaced fracture of fifth metatarsal bone, unspecified foot, initial encounter for closed fracture: Secondary | ICD-10-CM

## 2011-08-08 DIAGNOSIS — M810 Age-related osteoporosis without current pathological fracture: Secondary | ICD-10-CM | POA: Insufficient documentation

## 2011-08-08 DIAGNOSIS — X58XXXA Exposure to other specified factors, initial encounter: Secondary | ICD-10-CM | POA: Insufficient documentation

## 2011-08-09 ENCOUNTER — Encounter: Payer: Self-pay | Admitting: Family Medicine

## 2011-08-09 NOTE — Assessment & Plan Note (Signed)
Left oblique 5th metatarsal fracture - 6 1/2 weeks out from fracture though only 4 weeks of non-weight bearing.  X-rays without evidence of healing.  Clinically she is improved but has not put weight on this to test it.  Discussed case with Dr. Dion Saucier - believes this fracture is likely to heal but can take 3-5 months for radiographic healing - first 6 weeks nonweightbearing followed by 4 weeks of 50% weight bearing - if she does not tolerate this (discussed with patient), would be happy to see her in office to discuss ORIF.  Does not technically qualify as nonunion at this point.  Will see patient back in 4 weeks for repeat exam, x-rays (she has improved clinically) unless she does not tolerate 50% weight bearing - advised her to call me if this is the case and will refer her to Dr. Dion Saucier to discuss ORIF.

## 2011-08-09 NOTE — Progress Notes (Signed)
Subjective:    Patient ID: Catherine Monroe, female    DOB: 26-Jun-1960, 51 y.o.   MRN: [id number]  PCP: Dr. Linford Arnold  HPI  51 yo F here for f/u left foot fracture.  6/13: Patient reports on 5/19 she was coming over a baby gate when she fell to her left side. Some pain in left foot though not excruciating - was achy and improving. Then a bug scared her on 5/30 - she jumped back, put all weight on left foot and felt severe pain in lateral left foot. Some swelling and bruising. Difficulty bearing weight after this. Went to urgent care that day - had x-rays showing a 5th metatarsal shaft fracture - placed in short cam walker and referred here. Hurts if she puts any weight on left foot especially without cam walker. Not taking any medications and has stopped icing. Used motrin the first night.  6/27: Patient has been non-weight bearing since visit 2 weeks ago with crutches States pain has improved quite a bit. Using cam walker also. Sleeping in boot also. Not taking anything for pain. Elevating.  7/11: Patient has been non weight bearing with crutches and using cam walker. Not taking any medicines for pain. Pain has improved though getting spasms in bottom of foot. Elevating as well.  Past Medical History  Diagnosis Date  . 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 ibuprofen (ADVIL,MOTRIN) 200 MG tablet Take 200 mg by mouth daily as needed. For knee pain       . Magnesium 100 MG CAPS Take by mouth.        . Melatonin 2.5 MG CAPS Take by mouth at bedtime.          Past Surgical History  Procedure Date  . Right knee scope for torn meniscus 2/10  . Mastoidectomy 1970    Allergies  Allergen Reactions  . Penicillins     REACTION: hives and itching    History   Social History  . Marital  Status: Married    Spouse Name: N/A    Number of Children: 2  . Years of Education: N/A   Occupational History  . Not on file.   Social History Main Topics  . Smoking status: Former Smoker    Types: Cigarettes    Quit date: 01/29/1987  . Smokeless tobacco: Not on file  . Alcohol Use: Yes  . Drug Use: No  . Sexually Active: Yes -- Female partner(s)   Other Topics Concern  . Not on file   Social History Narrative   REgular exercise.     Family History  Problem Relation Age of Onset  . Hypertension Mother   . Hypertension Father   . Stroke Father   . Prostate cancer Father   . Sudden death Neg Hx   . Hyperlipidemia Neg Hx   . Heart attack Neg Hx   . Diabetes Neg Hx     BP 139/93  Pulse 73  Temp 98.1 F (36.7 C) (Oral)  Ht 5\' 2"  (1.575 m)  Wt 130 lb (58.968 kg)  BMI 23.78 kg/m2  LMP 07/16/2011  Review of Systems  See HPI above.    Objective:   Physical Exam  Gen: NAD  L foot/ankle: Mild swelling but no bruising left foot  laterally.  No other gross deformity, swelling, ecchymoses FROM ankle. Minimal TTP 5th metatarsal.  No other TTP about foot or ankle. Negative ant drawer and talar tilt.   Negative syndesmotic compression. Thompsons test negative. NV intact distally.    Assessment & Plan:  1. Left oblique 5th metatarsal fracture - 6 1/2 weeks out from fracture though only 4 weeks of non-weight bearing.  X-rays without evidence of healing.  Clinically she is improved but has not put weight on this to test it.  Discussed case with Dr. Dion Saucier - believes this fracture is likely to heal but can take 3-5 months for radiographic healing - first 6 weeks nonweightbearing followed by 4 weeks of 50% weight bearing in cam with crutches - if she does not tolerate this (discussed with patient), would be happy to see her in office to discuss ORIF.  Does not technically qualify as nonunion at this point.  Will see patient back in 4 weeks for repeat exam, x-rays (she has  improved clinically) unless she does not tolerate 50% weight bearing - advised her to call me if this is the case and will refer her to Dr. Dion Saucier to discuss ORIF.

## 2011-09-05 ENCOUNTER — Encounter: Payer: Self-pay | Admitting: Family Medicine

## 2011-09-05 ENCOUNTER — Ambulatory Visit (INDEPENDENT_AMBULATORY_CARE_PROVIDER_SITE_OTHER): Payer: 59 | Admitting: Family Medicine

## 2011-09-05 ENCOUNTER — Ambulatory Visit (HOSPITAL_BASED_OUTPATIENT_CLINIC_OR_DEPARTMENT_OTHER)
Admission: RE | Admit: 2011-09-05 | Discharge: 2011-09-05 | Disposition: A | Discharge status: Home / Self Care | Payer: 59 | Source: Ambulatory Visit | Attending: Family Medicine | Admitting: Family Medicine

## 2011-09-05 VITALS — BP 153/94 | HR 72 | Ht 62.0 in | Wt 130.0 lb

## 2011-09-05 DIAGNOSIS — S99929A Unspecified injury of unspecified foot, initial encounter: Secondary | ICD-10-CM

## 2011-09-05 DIAGNOSIS — X58XXXA Exposure to other specified factors, initial encounter: Secondary | ICD-10-CM | POA: Insufficient documentation

## 2011-09-05 DIAGNOSIS — S99919A Unspecified injury of unspecified ankle, initial encounter: Secondary | ICD-10-CM

## 2011-09-05 DIAGNOSIS — S99922A Unspecified injury of left foot, initial encounter: Secondary | ICD-10-CM

## 2011-09-05 DIAGNOSIS — S92309A Fracture of unspecified metatarsal bone(s), unspecified foot, initial encounter for closed fracture: Secondary | ICD-10-CM

## 2011-09-05 DIAGNOSIS — S92355A Nondisplaced fracture of fifth metatarsal bone, left foot, initial encounter for closed fracture: Secondary | ICD-10-CM

## 2011-09-05 NOTE — Assessment & Plan Note (Signed)
2 1/2 months out now - went from nonweightbearing to 50% weight bearing past 4 weeks.  Radiographically not much change but clinically much improved.  To continue wearing cam walker with ambulation - ok to take off to ice, bathe, sleep, and can short distances at home (to bathroom) without it if not painful.  No weight bearing exercise - can try swimming, cycling with low resistance if these are not painful.  F/u in 1 month to reevaluate clinically.  Again, can be up to 5 months before radiographically this would appear healed - will follow clinical improvement and advance activities based on that.

## 2011-09-05 NOTE — Progress Notes (Signed)
Subjective:    Patient ID: Catherine Monroe, female    DOB: 1960-12-22, 51 y.o.   MRN: [id number]  PCP: Dr. Linford Arnold  HPI  51 yo F here for f/u left foot fracture.  6/13: Patient reports on 5/19 she was coming over a baby gate when she fell to her left side. Some pain in left foot though not excruciating - was achy and improving. Then a bug scared her on 5/30 - she jumped back, put all weight on left foot and felt severe pain in lateral left foot. Some swelling and bruising. Difficulty bearing weight after this. Went to urgent care that day - had x-rays showing a 5th metatarsal shaft fracture - placed in short cam walker and referred here. Hurts if she puts any weight on left foot especially without cam walker. Not taking any medications and has stopped icing. Used motrin the first night.  6/27: Patient has been non-weight bearing since visit 2 weeks ago with crutches States pain has improved quite a bit. Using cam walker also. Sleeping in boot also. Not taking anything for pain. Elevating.  7/11: Patient has been non weight bearing with crutches and using cam walker. Not taking any medicines for pain. Pain has improved though getting spasms in bottom of foot. Elevating as well.  8/8: Patient now reports she is about 90% improved. No longer with pain at fracture site but has pain anterior left ankle. Wearing cam walker when ambulatory - has been 50% weight bearing and just past couple days gone to full weight bearing without a crutch - no pain or issues. Not taking any medications for pain. No other complaints.  Past Medical History  Diagnosis Date  . 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 ibuprofen (ADVIL,MOTRIN) 200 MG tablet Take 200 mg by mouth daily as needed. For knee pain       .  Magnesium 100 MG CAPS Take by mouth.        . Melatonin 2.5 MG CAPS Take by mouth at bedtime.          Past Surgical History  Procedure Date  . Right knee scope for torn meniscus 2/10  . Mastoidectomy 1970    Allergies  Allergen Reactions  . Penicillins     REACTION: hives and itching    History   Social History  . Marital Status: Married    Spouse Name: N/A    Number of Children: 2  . Years of Education: N/A   Occupational History  . Not on file.   Social History Main Topics  . Smoking status: Former Smoker    Types: Cigarettes    Quit date: 01/29/1987  . Smokeless tobacco: Not on file  . Alcohol Use: Yes  . Drug Use: No  . Sexually Active: Yes -- Female partner(s)   Other Topics Concern  . Not on file   Social History Narrative   REgular exercise.     Family History  Problem Relation Age of Onset  . Hypertension Mother   . Hypertension Father   . Stroke Father   . Prostate cancer Father   . Sudden death Neg Hx   . Hyperlipidemia Neg Hx   . Heart attack Neg Hx   . Diabetes Neg Hx  BP 153/94  Pulse 72  Ht 5\' 2"  (1.575 m)  Wt 130 lb (58.968 kg)  BMI 23.78 kg/m2  LMP 08/31/2011  Review of Systems  See HPI above.    Objective:   Physical Exam  Gen: NAD  L foot/ankle: No gross deformity, swelling, ecchymoses FROM ankle - mild pain with dorsiflexion. No 5th MT TTP.  Mild TTP over ant tibialis tendon as crosses ankle. No other TTP about foot or ankle. Negative ant drawer and talar tilt.   Negative syndesmotic compression. Thompsons test negative. NV intact distally.    Assessment & Plan:  1. Left oblique 5th metatarsal fracture - 2 1/2 months out now - went from nonweightbearing to 50% weight bearing past 4 weeks.  Radiographically not much change but clinically much improved.  To continue wearing cam walker with ambulation - ok to take off to ice, bathe, sleep, and can short distances at home (to bathroom) without it if not painful.  No  weight bearing exercise - can try swimming, cycling with low resistance if these are not painful.  F/u in 1 month to reevaluate clinically.  Again, can be up to 5 months before radiographically this would appear healed - will follow clinical improvement and advance activities based on that.

## 2011-10-04 ENCOUNTER — Encounter: Payer: Self-pay | Admitting: Family Medicine

## 2011-10-04 ENCOUNTER — Ambulatory Visit (INDEPENDENT_AMBULATORY_CARE_PROVIDER_SITE_OTHER): Payer: 59 | Admitting: Family Medicine

## 2011-10-04 VITALS — BP 145/95 | HR 76 | Ht 62.0 in | Wt 130.0 lb

## 2011-10-04 DIAGNOSIS — S92355A Nondisplaced fracture of fifth metatarsal bone, left foot, initial encounter for closed fracture: Secondary | ICD-10-CM

## 2011-10-04 DIAGNOSIS — S92309A Fracture of unspecified metatarsal bone(s), unspecified foot, initial encounter for closed fracture: Secondary | ICD-10-CM

## 2011-10-04 NOTE — Assessment & Plan Note (Signed)
3 1/2 months out now - clinically healed at this point. Struggling more with stiffness from being immobilized in boot - declined formal PT for this now but will call us if she wants to go ahead with this. Discontinue boot at this time. Ok to walk, elliptical for exercise now as long as pain no more than 2/10 level. No running for 4-6 weeks then discussed walk/jog program. Advised if not continuing to improve to follow up with me in 4-6 weeks. Did not repeat radiographs because as noted can be up to 5 months to see radiographic healing and would not change management - follow clinical healing.

## 2011-10-04 NOTE — Progress Notes (Signed)
Subjective:    Patient ID: Catherine Monroe, female    DOB: 12-28-1960, 51 y.o.   MRN: [id number]  PCP: Dr. Linford Arnold  HPI  51 yo F here for f/u left foot fracture.  6/13: Patient reports on 5/19 she was coming over a baby gate when she fell to her left side. Some pain in left foot though not excruciating - was achy and improving. Then a bug scared her on 5/30 - she jumped back, put all weight on left foot and felt severe pain in lateral left foot. Some swelling and bruising. Difficulty bearing weight after this. Went to urgent care that day - had x-rays showing a 5th metatarsal shaft fracture - placed in short cam walker and referred here. Hurts if she puts any weight on left foot especially without cam walker. Not taking any medications and has stopped icing. Used motrin the first night.  6/27: Patient has been non-weight bearing since visit 2 weeks ago with crutches States pain has improved quite a bit. Using cam walker also. Sleeping in boot also. Not taking anything for pain. Elevating.  7/11: Patient has been non weight bearing with crutches and using cam walker. Not taking any medicines for pain. Pain has improved though getting spasms in bottom of foot. Elevating as well.  8/8: Patient now reports she is about 90% improved. No longer with pain at fracture site but has pain anterior left ankle. Wearing cam walker when ambulatory - has been 50% weight bearing and just past couple days gone to full weight bearing without a crutch - no pain or issues. Not taking any medications for pain. No other complaints.  9/6: Patient is doing very well since last visit. Uses boot when outside and for long walks. Does well with boot off at home. Not taking anything for pain. Doing HEP - pain and stiffness at anterior ankle joint only now. Able to cycle without pain  Past Medical History  Diagnosis Date  . 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 ibuprofen (ADVIL,MOTRIN) 200 MG tablet Take 200 mg by mouth daily as needed. For knee pain       . Magnesium 100 MG CAPS Take by mouth.        . Melatonin 2.5 MG CAPS Take by mouth at bedtime.          Past Surgical History  Procedure Date  . Right knee scope for torn meniscus 2/10  . Mastoidectomy 1970    Allergies  Allergen Reactions  . Penicillins     REACTION: hives and itching    History   Social History  . Marital Status: Married    Spouse Name: N/A    Number of Children: 2  . Years of Education: N/A   Occupational History  . Not on file.   Social History Main Topics  . Smoking status: Former Smoker    Types: Cigarettes    Quit date: 01/29/1987  . Smokeless tobacco: Not on file  . Alcohol Use: Yes  . Drug Use: No  . Sexually Active: Yes -- Female partner(s)   Other Topics Concern  . Not on file   Social History Narrative   REgular exercise.     Family History  Problem Relation Age of Onset  .  Hypertension Mother   . Hypertension Father   . Stroke Father   . Prostate cancer Father   . Sudden death Neg Hx   . Hyperlipidemia Neg Hx   . Heart attack Neg Hx   . Diabetes Neg Hx     BP 145/95  Pulse 76  Ht 5\' 2"  (1.575 m)  Wt 130 lb (58.968 kg)  BMI 23.78 kg/m2  LMP 08/31/2011  Review of Systems  See HPI above.    Objective:   Physical Exam  Gen: NAD  L foot/ankle: No gross deformity, swelling, ecchymoses FROM ankle - 4/5 strength with ext rotation. No 5th MT TTP.  Mild TTP over ant tibialis tendon as crosses ankle. No other TTP about foot or ankle. Negative ant drawer and talar tilt.   Negative syndesmotic compression. Thompsons test negative. NV intact distally.    Assessment & Plan:  1. Left oblique 5th metatarsal fracture - 3 1/2 months out now - clinically healed at this point.   Struggling more with stiffness from being immobilized in boot - declined formal PT for this now but will call us if she wants to go ahead with this.  Discontinue boot at this time.  Ok to walk, elliptical for exercise now as long as pain no more than 2/10 level.  No running for 4-6 weeks then discussed walk/jog program.  Advised if not continuing to improve to follow up with me in 4-6 weeks.  Did not repeat radiographs because as noted can be up to 5 months to see radiographic healing and would not change management - follow clinical healing.

## 2013-02-23 ENCOUNTER — Encounter: Payer: Self-pay | Admitting: Family Medicine

## 2013-02-23 ENCOUNTER — Ambulatory Visit (INDEPENDENT_AMBULATORY_CARE_PROVIDER_SITE_OTHER): Payer: 59 | Admitting: Family Medicine

## 2013-02-23 VITALS — BP 142/84 | HR 73 | Temp 98.1°F | Ht 61.6 in | Wt 159.0 lb

## 2013-02-23 DIAGNOSIS — Z1231 Encounter for screening mammogram for malignant neoplasm of breast: Secondary | ICD-10-CM

## 2013-02-23 DIAGNOSIS — I10 Essential (primary) hypertension: Secondary | ICD-10-CM

## 2013-02-23 DIAGNOSIS — Z0000 Encounter for general adult medical examination without abnormal findings: Secondary | ICD-10-CM

## 2013-02-23 NOTE — Patient Instructions (Signed)
DASH Diet  The DASH diet stands for "Dietary Approaches to Stop Hypertension." It is a healthy eating plan that has been shown to reduce high blood pressure (hypertension) in as little as 14 days, while also possibly providing other significant health benefits. These other health benefits include reducing the risk of breast cancer after menopause and reducing the risk of type 2 diabetes, heart disease, colon cancer, and stroke. Health benefits also include weight loss and slowing kidney failure in patients with chronic kidney disease.   DIET GUIDELINES  · Limit salt (sodium). Your diet should contain less than 1500 mg of sodium daily.  · Limit refined or processed carbohydrates. Your diet should include mostly whole grains. Desserts and added sugars should be used sparingly.  · Include small amounts of heart-healthy fats. These types of fats include nuts, oils, and tub margarine. Limit saturated and trans fats. These fats have been shown to be harmful in the body.  CHOOSING FOODS   The following food groups are based on a 2000 calorie diet. See your Registered Dietitian for individual calorie needs.  Grains and Grain Products (6 to 8 servings daily)  · Eat More Often: Whole-wheat bread, brown rice, whole-grain or wheat pasta, quinoa, popcorn without added fat or salt (air popped).  · Eat Less Often: White bread, white pasta, white rice, cornbread.  Vegetables (4 to 5 servings daily)  · Eat More Often: Fresh, frozen, and canned vegetables. Vegetables may be raw, steamed, roasted, or grilled with a minimal amount of fat.  · Eat Less Often/Avoid: Creamed or fried vegetables. Vegetables in a cheese sauce.  Fruit (4 to 5 servings daily)  · Eat More Often: All fresh, canned (in natural juice), or frozen fruits. Dried fruits without added sugar. One hundred percent fruit juice (½ cup [237 mL] daily).  · Eat Less Often: Dried fruits with added sugar. Canned fruit in light or heavy syrup.  Lean Meats, Fish, and Poultry (2  servings or less daily. One serving is 3 to 4 oz [85-114 g]).  · Eat More Often: Ninety percent or leaner ground beef, tenderloin, sirloin. Round cuts of beef, chicken breast, turkey breast. All fish. Grill, bake, or broil your meat. Nothing should be fried.  · Eat Less Often/Avoid: Fatty cuts of meat, turkey, or chicken leg, thigh, or wing. Fried cuts of meat or fish.  Dairy (2 to 3 servings)  · Eat More Often: Low-fat or fat-free milk, low-fat plain or light yogurt, reduced-fat or part-skim cheese.  · Eat Less Often/Avoid: Milk (whole, 2%). Whole milk yogurt. Full-fat cheeses.  Nuts, Seeds, and Legumes (4 to 5 servings per week)  · Eat More Often: All without added salt.  · Eat Less Often/Avoid: Salted nuts and seeds, canned beans with added salt.  Fats and Sweets (limited)  · Eat More Often: Vegetable oils, tub margarines without trans fats, sugar-free gelatin. Mayonnaise and salad dressings.  · Eat Less Often/Avoid: Coconut oils, palm oils, butter, stick margarine, cream, half and half, cookies, candy, pie.  FOR MORE INFORMATION  The Dash Diet Eating Plan: www.dashdiet.org  Document Released: 01/03/2011 Document Revised: 04/08/2011 Document Reviewed: 01/03/2011  ExitCare® Patient Information ©2014 [employer].

## 2013-02-23 NOTE — Progress Notes (Signed)
Subjective:     Catherine Monroe is a 53 y.o. female and is here for a comprehensive physical exam. The patient reports no problems.  History   Social History  . Marital Status: Married    Spouse Name: N/A    Number of Children: 2  . Years of Education: N/A   Occupational History  . Not on file.   Social History Main Topics  . Smoking status: Former Smoker    Types: Cigarettes    Quit date: 01/29/1987  . Smokeless tobacco: Not on file  . Alcohol Use: Yes  . Drug Use: No  . Sexual Activity: Yes    Partners: Male   Other Topics Concern  . Not on file   Social History Narrative   REgular exercise.    Health Maintenance  Topic Date Due  . Mammogram  07/28/2012  . Colonoscopy  07/28/2012  . Influenza Vaccine  08/28/2013  . Pap Smear  03/19/2014  . Tetanus/tdap  03/19/2021    The following portions of the patient's history were reviewed and updated as appropriate: allergies, current medications, past family history, past medical history, past social history, past surgical history and problem list.  Review of Systems A comprehensive review of systems was negative.   Objective:    BP 155/86  Pulse 73  Temp(Src) 98.1 F (36.7 C)  Ht 5' 1.6" (1.565 m)  Wt 159 lb (72.122 kg)  BMI 29.45 kg/m2  SpO2 99% General appearance: alert, cooperative and appears stated age Head: Normocephalic, without obvious abnormality, atraumatic Eyes: conj clear, EOMi, PEERLA Ears: normal TM's and external ear canals both ears Nose: Nares normal. Septum midline. Mucosa normal. No drainage or sinus tenderness. Throat: lips, mucosa, and tongue normal; teeth and gums normal Neck: no adenopathy, no carotid bruit, no JVD, supple, symmetrical, trachea midline and thyroid not enlarged, symmetric, no tenderness/mass/nodules Back: symmetric, no curvature. ROM normal. No CVA tenderness. Lungs: clear to auscultation bilaterally Breasts: normal appearance, no masses or tenderness Heart: regular rate  and rhythm, S1, S2 normal, no murmur, click, rub or gallop Abdomen: soft, non-tender; bowel sounds normal; no masses,  no organomegaly Pelvic: Not peformed.  Extremities: extremities normal, atraumatic, no cyanosis or edema Pulses: 2+ and symmetric Skin: Skin color, texture, turgor normal. No rashes or lesions Lymph nodes: Cervical, supraclavicular, and axillary nodes normal. Neurologic: Alert and oriented X 3, normal strength and tone. Normal symmetric reflexes. Normal coordination and gait    Assessment:    Healthy female exam.      Plan:     See After Visit Summary for Counseling Recommendations  complete physical examination  Keep up a regular exercise program and make sure you are eating a healthy diet Try to eat 4 servings of dairy a day, or if you are lactose intolerant take a calcium with vitamin D daily.  Your vaccines are up to date.  Overdue for mammogram. She says she will call to schedule herself. Also due for colon cancer screening since she is now age 65. Discussed options. She would like to think about it.  Hypertension-we had a long discussion about the need to treat her blood pressure. When I look back she had multiple elevated blood pressures going back as far as 2012 even when she was at a lighter weight. Her ideal weight is around 120s and 35 pounds. And even when she was that weight her systolic pressures and diastolic pressures were borderline elevated. I do think her pressures are better when she's lighter  but still not well controlled. She has a very strong family history of hypertension. Discussed that she really needs to consider starting a medication such as a low dose diuretic or ACE inhibitor. She says she really wants to work on diet and exercise for the next 2 months and then followup. I discussed that she needs either consider taking a pill or setting her short-term goal in coming back in and if her blood pressure is not at goal admitting in her mind to  taking the medication at that point in time. I think she felt that was reasonable. She did become a little bit tearful during the discussion. Overall she feels she doesn't good job with low salt diet. She has not been exercising.

## 2013-02-24 LAB — LIPID PANEL
CHOL/HDL RATIO: 3.3 ratio
Cholesterol: 191 mg/dL (ref 0–200)
HDL: 58 mg/dL (ref >39)
LDL CALC: 106 mg/dL — AB (ref 0–99)
TRIGLYCERIDES: 135 mg/dL (ref <150)
VLDL: 27 mg/dL (ref 0–40)

## 2013-02-24 LAB — COMPLETE METABOLIC PANEL WITH GFR
ALT: 30 U/L (ref 0–35)
AST: 21 U/L (ref 0–37)
Albumin: 4.6 g/dL (ref 3.5–5.2)
Alkaline Phosphatase: 67 U/L (ref 39–117)
BUN: 11 mg/dL (ref 6–23)
CHLORIDE: 100 meq/L (ref 96–112)
CO2: 26 mEq/L (ref 19–32)
Calcium: 9.2 mg/dL (ref 8.4–10.5)
Creat: 0.59 mg/dL (ref 0.50–1.10)
GFR, Est African American: >89 mL/min
GFR, Est Non African American: >89 mL/min
GLUCOSE: 89 mg/dL (ref 70–99)
POTASSIUM: 3.8 meq/L (ref 3.5–5.3)
SODIUM: 138 meq/L (ref 135–145)
Total Bilirubin: 0.8 mg/dL (ref 0.3–1.2)
Total Protein: 7.2 g/dL (ref 6.0–8.3)

## 2013-02-24 NOTE — Progress Notes (Signed)
Quick Note:  All labs are normal. ______ 

## 2013-03-02 ENCOUNTER — Encounter: Payer: Self-pay | Admitting: Family Medicine

## 2013-05-14 ENCOUNTER — Encounter: Payer: Self-pay | Admitting: Family Medicine

## 2014-01-28 DIAGNOSIS — C50919 Malignant neoplasm of unspecified site of unspecified female breast: Secondary | ICD-10-CM

## 2014-01-28 HISTORY — PX: BREAST LUMPECTOMY: SHX2

## 2014-01-28 HISTORY — DX: Malignant neoplasm of unspecified site of unspecified female breast: C50.919

## 2014-07-08 ENCOUNTER — Other Ambulatory Visit: Payer: Self-pay | Admitting: Family Medicine

## 2014-07-08 DIAGNOSIS — Z1231 Encounter for screening mammogram for malignant neoplasm of breast: Secondary | ICD-10-CM

## 2014-07-21 ENCOUNTER — Ambulatory Visit (INDEPENDENT_AMBULATORY_CARE_PROVIDER_SITE_OTHER): Payer: 59

## 2014-07-21 DIAGNOSIS — R928 Other abnormal and inconclusive findings on diagnostic imaging of breast: Secondary | ICD-10-CM

## 2014-07-21 DIAGNOSIS — Z1231 Encounter for screening mammogram for malignant neoplasm of breast: Secondary | ICD-10-CM

## 2014-07-25 ENCOUNTER — Other Ambulatory Visit: Payer: Self-pay | Admitting: Family Medicine

## 2014-07-25 DIAGNOSIS — R928 Other abnormal and inconclusive findings on diagnostic imaging of breast: Secondary | ICD-10-CM

## 2014-08-10 ENCOUNTER — Other Ambulatory Visit: Payer: Self-pay | Admitting: Family Medicine

## 2014-08-10 ENCOUNTER — Ambulatory Visit
Admission: RE | Admit: 2014-08-10 | Discharge: 2014-08-10 | Disposition: A | Discharge status: Home / Self Care | Payer: 59 | Source: Ambulatory Visit | Attending: Family Medicine | Admitting: Family Medicine

## 2014-08-10 DIAGNOSIS — R928 Other abnormal and inconclusive findings on diagnostic imaging of breast: Secondary | ICD-10-CM

## 2014-08-10 HISTORY — PX: BIOPSY BREAST: PRO8

## 2014-08-17 ENCOUNTER — Telehealth: Payer: Self-pay | Admitting: *Deleted

## 2014-08-17 ENCOUNTER — Other Ambulatory Visit: Payer: Self-pay | Admitting: Surgery

## 2014-08-17 NOTE — Telephone Encounter (Signed)
Received referral from [facility] requesting pt to see either Dr. Jana Hakim or Dr. Lindi Adie.  Called pt and confirmed 08/19/14 appt w/ her.  Unable to mail before appt letter - gave verbal.  Unable to mail welcoming packet - gave directions and instructions.  Unable to mail intake form - placed a note for one to be given at time of check in.  Emailed Aimee at [facility] to make her aware.  Asked Dawn to print Dr. Pollie Friar office note after pt sees him tomorrow 7/21 and give to Dr. Lindi Adie.  Made Dr. Lindi Adie aware of note.

## 2014-08-18 ENCOUNTER — Other Ambulatory Visit: Payer: Self-pay | Admitting: Surgery

## 2014-08-18 DIAGNOSIS — C50912 Malignant neoplasm of unspecified site of left female breast: Secondary | ICD-10-CM

## 2014-08-19 ENCOUNTER — Ambulatory Visit (HOSPITAL_BASED_OUTPATIENT_CLINIC_OR_DEPARTMENT_OTHER): Payer: 59 | Admitting: Hematology and Oncology

## 2014-08-19 ENCOUNTER — Ambulatory Visit: Payer: 59

## 2014-08-19 ENCOUNTER — Encounter: Payer: Self-pay | Admitting: Hematology and Oncology

## 2014-08-19 ENCOUNTER — Encounter: Payer: Self-pay | Admitting: *Deleted

## 2014-08-19 VITALS — BP 154/78 | HR 68 | Temp 98.7°F | Resp 18 | Ht 61.6 in | Wt 115.2 lb

## 2014-08-19 DIAGNOSIS — Z170 Estrogen receptor positive status [ER+]: Secondary | ICD-10-CM

## 2014-08-19 DIAGNOSIS — C50512 Malignant neoplasm of lower-outer quadrant of left female breast: Secondary | ICD-10-CM | POA: Diagnosis not present

## 2014-08-19 NOTE — Progress Notes (Signed)
[facility] NOTE  Patient Care Team: Hali Marry, MD as PCP - General  CHIEF COMPLAINTS/PURPOSE OF CONSULTATION:  Newly diagnosed breast cancer  HISTORY OF PRESENTING ILLNESS:  Catherine Monroe 54 y.o. female is here because of recent diagnosis of left breast cancer. She had routine screening mammogram which showed an abnormality in the left breast which led to biopsy on 08/10/2014. 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 Ultrasound revealed a 1.3 cm mass but there was additional area of spiculations and microcalcifications posterior to this measuring 3-4 cm. She is scheduled to undergo breast MRI. We are consulted to discuss adjuvant treatment plans. Patient has been eating a very healthy diet for the past several months mostly vegan and has been losing significant amount of weight.  I reviewed her records extensively and collaborated the history with the patient.  SUMMARY OF ONCOLOGIC HISTORY:   Breast cancer of lower-outer quadrant of left female breast   07/21/2014 Mammogram Left breast mass based on mammogram done [facility], breast density C; diagnostic mammogram 08/10/2014 pleomorphic calcifications posterolateral to the mass and 3-4 cm   08/10/2014 Initial Diagnosis Left breast biopsy 4:00 position: Invasive ductal carcinoma with DCIS and associated microcalcifications ER 100%, PR 30%, HER-2/neu negative, Ki-67 3%, grade 1   08/10/2014 Imaging Ultrasound left breast: Irregular hypoechoic mass left breast 1.3 x 1.3 x 0.6 cm with additional echogenic foci corresponding to the spiculated mass and microcalcifications    MEDICAL HISTORY:  Past Medical History  Diagnosis Date  . 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 Right knee scope for torn meniscus  2/10  . Mastoidectomy  1970    SOCIAL HISTORY: History   Social History  . Marital Status: Married    Spouse Name: N/A  . Number of Children: 2  . Years of Education: N/A   Occupational History  . Not on file.   Social History Main Topics  . Smoking status: Former Smoker    Types: Cigarettes    Quit date: 01/29/1987  . Smokeless tobacco: Not on file  . Alcohol Use: Yes  . Drug Use: No  . Sexual Activity:    Partners: Male   Other Topics Concern  . Not on file   Social History Narrative   REgular exercise.     FAMILY HISTORY: Family History  Problem Relation Age of Onset  . Hypertension Mother   . Hypertension Father   . Stroke Father   . Prostate cancer Father   . Sudden death Neg Hx   . Hyperlipidemia Neg Hx   . Heart attack Neg Hx   . Diabetes Neg Hx     ALLERGIES:  is allergic to penicillins.  MEDICATIONS:  Current Outpatient Prescriptions  Medication Sig Dispense Refill  . Magnesium 100 MG CAPS Take by mouth.      . Melatonin 2.5 MG CAPS Take by mouth at bedtime.       No current facility-administered medications for this visit.    REVIEW OF SYSTEMS:   Constitutional: Denies fevers, chills or abnormal night sweats Eyes: Denies blurriness of vision, double vision or watery eyes Ears, nose, mouth, throat, and face: Denies mucositis or sore throat  Respiratory: Denies cough, dyspnea or wheezes Cardiovascular: Denies palpitation, chest discomfort or lower extremity swelling Gastrointestinal:  Denies nausea, heartburn or change in bowel habits Skin: Denies abnormal skin rashes Lymphatics: Denies new lymphadenopathy or easy bruising Neurological:Denies numbness, tingling or new weaknesses Behavioral/Psych: Mood is stable, no new changes  Breast:  Denies any palpable lumps or discharge All other systems were reviewed with the patient and are negative.  PHYSICAL EXAMINATION: ECOG  PERFORMANCE STATUS: 0 - Asymptomatic  Filed Vitals:   08/19/14 0817  BP: 154/78  Pulse: 68  Temp: 98.7 F (37.1 C)  Resp: 18   Filed Weights   08/19/14 0817  Weight: 115 lb 3.2 oz (52.254 kg)    GENERAL:alert, no distress and comfortable SKIN: skin color, texture, turgor are normal, no rashes or significant lesions EYES: normal, conjunctiva are pink and non-injected, sclera clear OROPHARYNX:no exudate, no erythema and lips, buccal mucosa, and tongue normal  NECK: supple, thyroid normal size, non-tender, without nodularity LYMPH:  no palpable lymphadenopathy in the cervical, axillary or inguinal LUNGS: clear to auscultation and percussion with normal breathing effort HEART: regular rate & rhythm and no murmurs and no lower extremity edema ABDOMEN:abdomen soft, non-tender and normal bowel sounds Musculoskeletal:no cyanosis of digits and no clubbing  PSYCH: alert & oriented x 3 with fluent speech NEURO: no focal motor/sensory deficits BREAST: No palpable nodules in breast. No palpable axillary or supraclavicular lymphadenopathy (exam performed in the presence of a chaperone)   LABORATORY DATA:  I have reviewed the data as listed Lab Results  Component Value Date   HGB 13.2 03/17/2008   Lab Results  Component Value Date   NA 138 02/23/2013   K 3.8 02/23/2013   CL 100 02/23/2013   CO2 26 02/23/2013    RADIOGRAPHIC STUDIES: I have personally reviewed the radiological reports and agreed with the findings in the report. 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 After reviewing the pathology in detail, we proceeded to discuss the different treatment options between surgery, radiation, chemotherapy, antiestrogen therapies.  Recommendations: Breast MRI 1. Breast conserving surgery followed by 2. Oncotype DX testing to determine if chemotherapy would be of any benefit followed by 3. Adjuvant radiation therapy followed by 4. Adjuvant antiestrogen therapy  Oncotype counseling: I discussed Oncotype DX test. I explained to the patient that this is a 21 gene panel to evaluate patient tumors DNA to calculate recurrence score. This would help determine whether patient has high risk or intermediate risk or low risk breast cancer. She understands that if her tumor was found to be high risk, she would benefit from systemic chemotherapy. If low risk, no need of chemotherapy. If she was found to be intermediate risk, we would need to evaluate the score as well as other risk factors and determine if an abbreviated chemotherapy may be of benefit.  Return to clinic after surgery to discuss final pathology report and then determine if Oncotype DX testing will need to be sent.  All questions were answered. The patient knows to  call the clinic with any problems, questions or concerns.    Rulon Eisenmenger, MD 8:54 AM

## 2014-08-19 NOTE — Assessment & Plan Note (Signed)
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 After reviewing the pathology in detail, we proceeded to discuss the different treatment options between surgery, radiation, chemotherapy, antiestrogen therapies.  Recommendations: Breast MRI 1. Breast conserving surgery followed by 2. Oncotype DX testing to determine if chemotherapy would be of any benefit followed by 3. Adjuvant radiation therapy followed by 4. Adjuvant antiestrogen therapy  Oncotype counseling: I discussed Oncotype DX test. I explained to the patient that this is a 21 gene panel to evaluate patient tumors DNA to calculate recurrence score. This would help determine whether patient has high risk or intermediate risk or low risk breast cancer. She understands that if her tumor was found to be high risk, she would benefit from systemic chemotherapy. If low risk, no need of chemotherapy. If she was found to be intermediate risk, we would need to evaluate the score as well as other risk factors and determine if an abbreviated chemotherapy may be of benefit.  Return to clinic after surgery to discuss final pathology report and then determine if Oncotype DX testing will need to be sent.

## 2014-08-19 NOTE — Progress Notes (Signed)
Checked in new pt with no financial concerns prior to seeing the dr.  Informed pt if chemo is part of her treatment we will contact her ins to see if Josem Kaufmann is required and will obtain it if it is as well as contact foundations that offer copay assistance for chemo if needed.  She has my card for any billing questions or concerns.

## 2014-08-19 NOTE — Progress Notes (Signed)
New pt intake form received.  Reviewed by Dr. Lindi Adie.   Chart updated.  Sent to scan.

## 2014-08-19 NOTE — Progress Notes (Signed)
Met with pt during new pt appt with Dr. Lindi Adie. Gave navigation resources and contact information. Denies questions or concerns regarding dx or treatment care plan. Encourage pt to call with needs. Received verbal understanding.

## 2014-08-22 ENCOUNTER — Telehealth: Payer: Self-pay | Admitting: Family Medicine

## 2014-08-22 NOTE — Telephone Encounter (Signed)
Called and spoke with Catherine Monroe today about her new diagnosis of breast cancer. Overall she was tearful but does seem to be positive. Just gave her encouragement and torticollis if there was anything that we can do to be helpful for her. She has a breast MRI scheduled and has artery seen Dr. Para March for surgery.

## 2014-08-26 ENCOUNTER — Ambulatory Visit
Admission: RE | Admit: 2014-08-26 | Discharge: 2014-08-26 | Disposition: A | Discharge status: Home / Self Care | Payer: 59 | Source: Ambulatory Visit | Attending: Surgery | Admitting: Surgery

## 2014-08-26 MED ORDER — GADOBENATE DIMEGLUMINE 529 MG/ML IV SOLN
10.0000 mL | Freq: Once | INTRAVENOUS | Status: AC | PRN
  Administered 2014-08-26: 10 mL via INTRAVENOUS

## 2014-08-28 ENCOUNTER — Other Ambulatory Visit: Payer: Self-pay | Admitting: Surgery

## 2014-08-28 DIAGNOSIS — C50912 Malignant neoplasm of unspecified site of left female breast: Secondary | ICD-10-CM

## 2014-08-29 ENCOUNTER — Other Ambulatory Visit: Payer: Self-pay | Admitting: Surgery

## 2014-08-29 DIAGNOSIS — N6310 Unspecified lump in the right breast, unspecified quadrant: Secondary | ICD-10-CM

## 2014-08-30 ENCOUNTER — Other Ambulatory Visit: Payer: Self-pay | Admitting: Surgery

## 2014-08-30 DIAGNOSIS — N6310 Unspecified lump in the right breast, unspecified quadrant: Secondary | ICD-10-CM

## 2014-08-31 ENCOUNTER — Other Ambulatory Visit: Payer: Self-pay | Admitting: Surgery

## 2014-08-31 DIAGNOSIS — R928 Other abnormal and inconclusive findings on diagnostic imaging of breast: Secondary | ICD-10-CM

## 2014-08-31 NOTE — Progress Notes (Signed)
Location of Breast Cancer:Spiculaed mass of left breast lower-outer quadrant.  Histology per Pathology Report:08/10/14  Diagnosis Breast, left, needle core biopsy, 4 o'clock - INVASIVE DUCTAL CARCINOMA. - DUCTAL CARCINOMA IN SITU. - ASSOCIATED MICROCALCIFICATIONS. - SEE COMMENT.  Receptor Status: ER(+), PR (+), Her2-neu (-)  Did patient present with symptoms (if so, please note symptoms) or was this found on screening mammography?:Screening mammogram performed  On 07/21/14 revealed possible mass.Biopsy completed on 08/10/14 revealed invasive ductal carcinoma  Past/Anticipated interventions by surgeon, if LZJ:QBHALPF is Dr.Newman.Surgery not scheduled yet.  Past/Anticipated interventions by medical oncology, if any: Chemotherapy   Lymphedema issues, if any:   Pain issues, if any: soreness from biopsy.  SAFETY ISSUES:  Prior radiation? No  Pacemaker/ICD? No  Possible current pregnancy?No.went through menopause 1 year ago.April 2015.  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 Quit smoking age 70 Allergies:penicillin. No family history of breast or female cancer.    Arlyss Repress, RN 08/31/2014,1:34 PM  BP 129/86 mmHg  Pulse 62  Temp(Src) 97.9 F (36.6 C)  Resp 18  Wt 112 lb 9.6 oz (51.075 kg)  SpO2 100%

## 2014-09-01 ENCOUNTER — Other Ambulatory Visit: Payer: Self-pay | Admitting: Surgery

## 2014-09-01 ENCOUNTER — Ambulatory Visit
Admission: RE | Admit: 2014-09-01 | Discharge: 2014-09-01 | Disposition: A | Discharge status: Home / Self Care | Payer: 59 | Source: Ambulatory Visit | Attending: Radiation Oncology | Admitting: Radiation Oncology

## 2014-09-01 ENCOUNTER — Encounter: Payer: Self-pay | Admitting: Radiation Oncology

## 2014-09-01 DIAGNOSIS — C50512 Malignant neoplasm of lower-outer quadrant of left female breast: Secondary | ICD-10-CM | POA: Insufficient documentation

## 2014-09-01 DIAGNOSIS — R928 Other abnormal and inconclusive findings on diagnostic imaging of breast: Secondary | ICD-10-CM

## 2014-09-01 DIAGNOSIS — C50912 Malignant neoplasm of unspecified site of left female breast: Secondary | ICD-10-CM | POA: Diagnosis not present

## 2014-09-01 DIAGNOSIS — Z510 Encounter for antineoplastic radiation therapy: Secondary | ICD-10-CM | POA: Insufficient documentation

## 2014-09-01 DIAGNOSIS — Z170 Estrogen receptor positive status [ER+]: Secondary | ICD-10-CM | POA: Insufficient documentation

## 2014-09-01 DIAGNOSIS — C773 Secondary and unspecified malignant neoplasm of axilla and upper limb lymph nodes: Secondary | ICD-10-CM | POA: Insufficient documentation

## 2014-09-01 NOTE — Progress Notes (Addendum)
Radiation Oncology         [id number]) [phone] ________________________________  Initial Outpatient Consultation - Date: 09/01/2014   Name: Catherine Monroe MRN: [id number]   DOB: 01-Feb-1960  REFERRING PHYSICIAN: Alphonsa Overall, MD  DIAGNOSIS AND STAGE: Catherine Monroe is a 54 year old female presenting to clinic in regards to her cancer of the left female (ER positive).  HISTORY OF PRESENT ILLNESS:Catherine Monroe is a 54 y.o. female presenting to clinic in regards to her cancer of the left female breast (ER positive). The patient's disease was discovered via a screening mammogram where an abnormality was seen in the left breast. Biopsy was performed on 08/10/14 of a left breast mass which sowed a Grade 1 Invasive Ductal carcinoma. Which was ER/PR positive and HER2 negative. The mass measured 1.3 cm on ultrasound but on mammogram an additional 3-4 cm of calcifications were noted concerning for DCIS.  Marland Kitchen An MRI scan showed a 0.9 cm mass  in the right breast and she is scheduled for further work up and ultrasound next week. Dr.Gudena has educated the patient on the benefits and purpose of the oncotype test. The patient was referred to radiation oncology to discuss the management of her disease  She was accompanied by her husband for today's radiation oncology appointment and displayed anxiety about treatments, which was addressed. She stated that her sister-in-law, who was only 6 months older in age, has recently died of breast cancer. She has recently switched to a vegan diet and has intentionally lost weight over the past few months as a result. She is interested in breast conservation.   PREVIOUS RADIATION THERAPY: No  Past medical, social and family history were reviewed in the electronic chart. Review of symptoms was reviewed in the electronic chart. Medications were reviewed in the electronic chart.   PHYSICAL EXAM: The patient is alert and oriented x3. Filed Vitals:   09/01/14 1134  BP: 129/86  Pulse: 62    Temp: 97.9 F (36.6 C)  Resp: 18  .112 lb 9.6 oz (51.075 kg).   IMPRESSION: Catherine Monroe is a 54 year old female presenting to clinic in regards to her cancer of the leftt female breast (ER positive).   PLAN: I spoke to the patient today regarding her diagnosis and options for treatment. We discussed the equivalence in terms of survival and local failure between mastectomy and breast conservation. We discussed the role of radiation in decreasing local failures in patients who undergo lumpectomy. We discussed the process of simulation and the placement tattoos. We discussed 4 weeks of treatment as an outpatient. We discussed the possibility of asymptomatic lung damage. We discussed the low likelihood of secondary malignancies. We discussed the possible side effects including but not limited to skin redness, fatigue, permanent skin darkening, and breast swelling.  We discussed the use of cardiac sparing with deep inspiration breath hold if needed.    Dr. Lucia Gaskins did not recommend a biopsy of this calcifications as he felt they would need to be removed surgically regardless of biopsy at the time of lumpectomy. An ultrasound and possible biopsy of the mass in the right breast is scheduled for 8/9.  I will see her back once her surgery and oncotype test results are complete.   I spent 60 minutes  face to face with the patient and more than 50% of that time was spent in counseling and/or coordination of care.   This document serves as a record of services personally performed by Thea Silversmith ,  MD. It was created on her behalf by Lenn Cal, a trained medical scribe. The creation of this record is based on the scribe's personal observations and the provider's statements to them. This document has been checked and approved by the attending provider.   __________________________________  Thea Silversmith, MD

## 2014-09-01 NOTE — Progress Notes (Signed)
Please see the Nurse Progress Note in the MD Initial Consult Encounter for this patient. 

## 2014-09-02 ENCOUNTER — Other Ambulatory Visit: Payer: Self-pay

## 2014-09-02 ENCOUNTER — Other Ambulatory Visit: Payer: Self-pay | Admitting: Surgery

## 2014-09-02 ENCOUNTER — Encounter: Payer: Self-pay | Admitting: *Deleted

## 2014-09-02 ENCOUNTER — Ambulatory Visit: Payer: 59 | Admitting: Radiation Oncology

## 2014-09-02 ENCOUNTER — Telehealth: Payer: Self-pay | Admitting: Hematology and Oncology

## 2014-09-02 ENCOUNTER — Ambulatory Visit: Payer: 59

## 2014-09-02 DIAGNOSIS — C50912 Malignant neoplasm of unspecified site of left female breast: Secondary | ICD-10-CM

## 2014-09-02 DIAGNOSIS — R928 Other abnormal and inconclusive findings on diagnostic imaging of breast: Secondary | ICD-10-CM

## 2014-09-02 NOTE — Telephone Encounter (Signed)
lvm for pt regarding to Aug appt....mailed pt appt sched adn letter °

## 2014-09-06 ENCOUNTER — Ambulatory Visit
Admission: RE | Admit: 2014-09-06 | Discharge: 2014-09-06 | Disposition: A | Discharge status: Home / Self Care | Payer: 59 | Source: Ambulatory Visit | Attending: Surgery | Admitting: Surgery

## 2014-09-06 ENCOUNTER — Other Ambulatory Visit: Payer: 59

## 2014-09-06 DIAGNOSIS — R928 Other abnormal and inconclusive findings on diagnostic imaging of breast: Secondary | ICD-10-CM

## 2014-09-07 ENCOUNTER — Other Ambulatory Visit: Payer: Self-pay | Admitting: Surgery

## 2014-09-07 DIAGNOSIS — R921 Mammographic calcification found on diagnostic imaging of breast: Secondary | ICD-10-CM

## 2014-09-07 NOTE — Pre-Procedure Instructions (Signed)
    MARYANNA STUBER  09/07/2014      [id number] - [place], [place] - [address] [place]  [place] [address] Phone: [phone] Fax: [phone]    Your procedure is scheduled on Tuesday, September 13, 2014  Report to [facility] Admitting at 8:30 A.M.  Call this number if you have problems the morning of surgery:  [phone]   Remember:  Do not eat food or drink liquids after midnight Monday, September 12, 2014  Take these medicines the morning of surgery with A SIP OF WATER : NONE Stop taking Aspirin, vitamins and herbal medications such as Melatonin. Do not take any NSAIDs ie: Ibuprofen, Advil, Naproxen or any medication containing Aspirin; stop now.  Do not wear jewelry, make-up or nail polish.  Do not wear lotions, powders, or perfumes.  You may not wear deodorant.  Do not shave 48 hours prior to surgery.    Do not bring valuables to the hospital.  [facility] is not responsible for any belongings or valuables.  Contacts, dentures or bridgework may not be worn into surgery.  Leave your suitcase in the car.  After surgery it may be brought to your room.  For patients admitted to the hospital, discharge time will be determined by your treatment team.  Patients discharged the day of surgery will not be allowed to drive home.    Special instructions: Shower the night before surgery and the morning of surgery with CHG.  Please read over the following fact sheets that you were given. Pain Booklet, Coughing and Deep Breathing and Surgical Site Infection Prevention

## 2014-09-08 ENCOUNTER — Other Ambulatory Visit: Payer: Self-pay

## 2014-09-08 ENCOUNTER — Encounter (HOSPITAL_COMMUNITY)
Admission: RE | Admit: 2014-09-08 | Discharge: 2014-09-08 | Disposition: A | Discharge status: Home / Self Care | Payer: 59 | Source: Ambulatory Visit | Attending: Surgery | Admitting: Surgery

## 2014-09-08 ENCOUNTER — Telehealth: Payer: Self-pay

## 2014-09-08 ENCOUNTER — Encounter (HOSPITAL_COMMUNITY): Payer: Self-pay | Admitting: Emergency Medicine

## 2014-09-08 ENCOUNTER — Encounter (HOSPITAL_COMMUNITY): Payer: Self-pay

## 2014-09-08 VITALS — BP 154/79 | HR 57 | Temp 98.1°F | Resp 18 | Ht 61.5 in | Wt 110.4 lb

## 2014-09-08 DIAGNOSIS — C50512 Malignant neoplasm of lower-outer quadrant of left female breast: Secondary | ICD-10-CM | POA: Insufficient documentation

## 2014-09-08 DIAGNOSIS — R001 Bradycardia, unspecified: Secondary | ICD-10-CM | POA: Insufficient documentation

## 2014-09-08 DIAGNOSIS — Z01812 Encounter for preprocedural laboratory examination: Secondary | ICD-10-CM | POA: Insufficient documentation

## 2014-09-08 DIAGNOSIS — Z87891 Personal history of nicotine dependence: Secondary | ICD-10-CM | POA: Insufficient documentation

## 2014-09-08 DIAGNOSIS — I10 Essential (primary) hypertension: Secondary | ICD-10-CM | POA: Diagnosis not present

## 2014-09-08 DIAGNOSIS — Z01818 Encounter for other preprocedural examination: Secondary | ICD-10-CM | POA: Insufficient documentation

## 2014-09-08 DIAGNOSIS — H9191 Unspecified hearing loss, right ear: Secondary | ICD-10-CM | POA: Diagnosis not present

## 2014-09-08 HISTORY — DX: Malignant (primary) neoplasm, unspecified: C80.1

## 2014-09-08 LAB — CBC
HCT: 43 % (ref 36.0–46.0)
Hemoglobin: 14.1 g/dL (ref 12.0–15.0)
MCH: 30.3 pg (ref 26.0–34.0)
MCHC: 32.8 g/dL (ref 30.0–36.0)
MCV: 92.3 fL (ref 78.0–100.0)
Platelets: 181 10*3/uL (ref 150–400)
RBC: 4.66 MIL/uL (ref 3.87–5.11)
RDW: 12.8 % (ref 11.5–15.5)
WBC: 4.9 10*3/uL (ref 4.0–10.5)

## 2014-09-08 LAB — BASIC METABOLIC PANEL
Anion gap: 10 (ref 5–15)
CO2: 27 mmol/L (ref 22–32)
CREATININE: 0.75 mg/dL (ref 0.44–1.00)
Calcium: 9.4 mg/dL (ref 8.9–10.3)
Chloride: 107 mmol/L (ref 101–111)
GFR calc Af Amer: >60 mL/min (ref >60)
GFR calc non Af Amer: >60 mL/min (ref >60)
Glucose, Bld: 89 mg/dL (ref 65–99)
Potassium: 3.9 mmol/L (ref 3.5–5.1)
Sodium: 144 mmol/L (ref 135–145)

## 2014-09-08 NOTE — Progress Notes (Signed)
PCP is [name] Denies seeing a cardiologist Denies ever having a card cath, echo, or stress test Pt reports that [name] ordered labs for her to have drawn today. No orders noted from [name], called his office and spoke with [name]. [name] did enter orders and labs were drawn

## 2014-09-08 NOTE — Progress Notes (Signed)
Anesthesia Chart Review:  Pt is 54 year old female scheduled for L breast lumpectomy with two wire bracket needle localization and L axillary sentinel lymph node bx on 09/13/2014 with Dr. Keturah Barre. Newman.   PMH includes: HTN, breast cancer. Hearing loss on the R. Former smoker. BMI 20.5.   Preoperative labs reviewed.    EKG 09/08/2014: Sinus bradycardia (52 bpm). Anteroseptal infarct, age undetermined  Reviewed case/EKG with Dr. Therisa Doyne.   If no changes, I anticipate pt can proceed with surgery as scheduled.   Willeen Cass, FNP-BC [facility]/Anesthesiology Phone: [phone] 09/08/2014 4:36 PM

## 2014-09-08 NOTE — Telephone Encounter (Signed)
[facility] from short stay called asking if [name] wanted to draw any hormone labs white pt over there at her pre-op appt. S/w [name] and ordered [facility] and estradiole ultrasensitive. Called [facility] back with information

## 2014-09-09 ENCOUNTER — Telehealth: Payer: Self-pay | Admitting: Hematology and Oncology

## 2014-09-09 LAB — FOLLICLE STIMULATING HORMONE: FSH: 167.4 m[IU]/mL

## 2014-09-09 LAB — ESTRADIOL

## 2014-09-09 NOTE — Telephone Encounter (Signed)
8/11 post op pof noted.pt already scheduled per 8/5 pof   anne

## 2014-09-13 ENCOUNTER — Other Ambulatory Visit (HOSPITAL_COMMUNITY): Payer: 59

## 2014-09-13 ENCOUNTER — Encounter (HOSPITAL_COMMUNITY): Admission: RE | Payer: Self-pay | Source: Ambulatory Visit

## 2014-09-13 ENCOUNTER — Ambulatory Visit
Admission: RE | Admit: 2014-09-13 | Discharge: 2014-09-13 | Disposition: A | Discharge status: Home / Self Care | Payer: 59 | Source: Ambulatory Visit | Attending: Surgery | Admitting: Surgery

## 2014-09-13 ENCOUNTER — Ambulatory Visit: Admission: RE | Admit: 2014-09-13 | Payer: 59 | Source: Ambulatory Visit

## 2014-09-13 ENCOUNTER — Ambulatory Visit (HOSPITAL_COMMUNITY): Admission: RE | Admit: 2014-09-13 | Payer: 59 | Source: Ambulatory Visit | Admitting: Surgery

## 2014-09-13 DIAGNOSIS — C50912 Malignant neoplasm of unspecified site of left female breast: Secondary | ICD-10-CM

## 2014-09-13 DIAGNOSIS — R921 Mammographic calcification found on diagnostic imaging of breast: Secondary | ICD-10-CM

## 2014-09-13 SURGERY — BREAST LUMPECTOMY WITH NEEDLE LOCALIZATION AND AXILLARY SENTINEL LYMPH NODE BX
Anesthesia: General | Laterality: Left

## 2014-09-13 MED ORDER — GADOBENATE DIMEGLUMINE 529 MG/ML IV SOLN
10.0000 mL | Freq: Once | INTRAVENOUS | Status: AC | PRN
  Administered 2014-09-13: 10 mL via INTRAVENOUS

## 2014-09-14 ENCOUNTER — Telehealth: Payer: Self-pay | Admitting: Hematology and Oncology

## 2014-09-14 ENCOUNTER — Other Ambulatory Visit: Payer: Self-pay | Admitting: Surgery

## 2014-09-14 DIAGNOSIS — C50912 Malignant neoplasm of unspecified site of left female breast: Secondary | ICD-10-CM

## 2014-09-14 NOTE — Telephone Encounter (Signed)
Patient called in to cancel her appointment as she did not have her surgery yet and will call back to reschedule when she has a date anne

## 2014-09-16 ENCOUNTER — Encounter: Payer: Self-pay | Admitting: Family Medicine

## 2014-09-19 ENCOUNTER — Encounter (HOSPITAL_BASED_OUTPATIENT_CLINIC_OR_DEPARTMENT_OTHER): Payer: Self-pay | Admitting: *Deleted

## 2014-09-20 ENCOUNTER — Ambulatory Visit: Payer: 59 | Admitting: Hematology and Oncology

## 2014-09-20 ENCOUNTER — Other Ambulatory Visit: Payer: Self-pay

## 2014-09-21 ENCOUNTER — Telehealth: Payer: Self-pay | Admitting: Hematology and Oncology

## 2014-09-21 NOTE — Telephone Encounter (Signed)
Called patient and she is aware of her follow up  Catherine Monroe

## 2014-09-22 NOTE — H&P (Signed)
Catherine Monroe. Catherine Monroe 08/18/2014 9:13 AM Location: [facility] Patient #: [id number] DOB: 1961-01-13 Married / Language: English / Race: White Female  History of Present Illness   Patient words: breast cancer.  The patient is a 54 year old female who presents with breast cancer.   Her PCP is Dr. Peterson Ao.  She comes with her husband.   The patient had not had a mammogram in 5 years. She had had a prior biopsy of her left breast at age 30. She had not noticed any changes in her breast, though she has lost some wieght and commented that part of that weight loss was her breast.  She had screening mammograms on 07/21/2014 at [facility] in [place]. This showed a possible mass in the left breast. Her density is "c".  The patient had a biopsy of her left breast at the 4 o'clock position on 10 August 2014. 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   She went through menopause about 1 year ago. She is on no hormone meds.   I talked to Dr. Theodosia Blender about her mammograms. The Ca++ trail off at least 3 or 4 cm from the main biopsy. I think that an MRI would be helpful in defining the anatomy of the tumor. I discussed this with Sharell.   I discussed the options for breast cancer treatment with the patient.  I discussed a multidisciplinary approach to the treatment of breast cancer, which includes medical oncology and radiation oncology.  I discussed the surgical options of lumpectomy vs. mastectomy.  If mastectomy, there is the possibility of reconstruction.   I discussed the options of lymph node biopsy.  The treatment plan depends on the pathologic staging of the tumor and the patient's personal wishes.  The risks of surgery include, but are not limited to, bleeding,  infection, the need for further surgery, and nerve injury.  Past Medical History: 1. 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 Fredirick Maudlin 2. Smoked, but quit at age 65 3. History of pilonidal 4. Has lost 30 pounds to control her BP  5. She is vegan and tries to control her health through her diet.  Social History: Married, husband Catherine Monroe. 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 Has two sons: 1 and 58 yo.   Other Problems (Ammie Eversole, LPN; 1/47/8295 6:21 AM) Breast Cancer High blood pressure Lump In Breast  Past Surgical History (Ammie Eversole, LPN; 04/04/6576 4:69 AM) Breast Biopsy Left. Knee Surgery Right. Oral Surgery  Diagnostic Studies History (Ammie Eversole, LPN; 07/27/5282 1:32 AM) Colonoscopy never Mammogram within last year Pap Smear 1-5 years ago  Allergies (Ammie Eversole, LPN; 4/40/1027 2:53 AM) Penicillins  Medication History (Ammie Eversole, LPN; 6/64/4034 7:42 AM) Magnesium (100MG  Tablet, Oral) Active. Melatonin (2.5MG  Capsule, Oral) Active. Medications Reconciled  Social History (Ammie Eversole, LPN; 5/95/6387 5:64 AM) Alcohol use Recently quit alcohol use. Caffeine use Coffee. No drug use Tobacco use Former smoker.  Family History Aleatha Borer, LPN; 3/32/9518 8:41 AM) Cerebrovascular Accident Father. Heart Disease Father. Hypertension Brother, Father, Mother, Sister. Kidney Disease Mother.  Pregnancy / Birth History Aleatha Borer, LPN; 6/60/6301 6:01 AM) Age at menarche 57 years. Age of menopause 64-55 Gravida 2 Irregular periods Maternal age 36-35 Para  2  Review of Systems (Ammie Eversole LPN; 5/62/5638 9:37 AM) General Not Present- Appetite Loss, Chills, Fatigue, Fever, Night Sweats, Weight Gain and Weight Loss. Skin Not Present- Change in Wart/Mole, Dryness, Hives, Jaundice, New Lesions, Non-Healing Wounds, Rash and Ulcer. HEENT  Not Present- Earache, Hearing Loss, Hoarseness, Nose Bleed, Oral Ulcers, Ringing in the Ears, Seasonal Allergies, Sinus Pain, Sore Throat, Visual Disturbances, Wears glasses/contact lenses and Yellow Eyes. Respiratory Not Present- Bloody sputum, Chronic Cough, Difficulty Breathing, Snoring and Wheezing. Breast Present- Breast Mass. Not Present- Breast Pain, Nipple Discharge and Skin Changes. Cardiovascular Not Present- Chest Pain, Difficulty Breathing Lying Down, Leg Cramps, Palpitations, Rapid Heart Rate, Shortness of Breath and Swelling of Extremities. Gastrointestinal Not Present- Abdominal Pain, Bloating, Bloody Stool, Change in Bowel Habits, Chronic diarrhea, Constipation, Difficulty Swallowing, Excessive gas, Gets full quickly at meals, Hemorrhoids, Indigestion, Nausea, Rectal Pain and Vomiting. Female Genitourinary Not Present- Frequency, Nocturia, Painful Urination, Pelvic Pain and Urgency. Musculoskeletal Not Present- Back Pain, Joint Pain, Joint Stiffness, Muscle Pain, Muscle Weakness and Swelling of Extremities. Neurological Not Present- Decreased Memory, Fainting, Headaches, Numbness, Seizures, Tingling, Tremor, Trouble walking and Weakness. Psychiatric Not Present- Anxiety, Bipolar, Change in Sleep Pattern, Depression, Fearful and Frequent crying. Endocrine Not Present- Cold Intolerance, Excessive Hunger, Hair Changes, Heat Intolerance, Hot flashes and New Diabetes. Hematology Not Present- Easy Bruising, Excessive bleeding, Gland problems, HIV and Persistent Infections.   Vitals (Ammie Eversole LPN; 3/42/8768 1:15 AM) 08/18/2014 9:13 AM Weight: 115.2 lb Height: 61.5in Body Surface Area: 1.51 m Body Mass Index: 21.41 kg/m Temp.: 98.8F(Oral)  Pulse: 70 (Regular)  BP: 124/72 (Sitting, Left Arm, Standard)  Physical Exam  General: WN middle aged WF alert and generally healthy appearing. HEENT: Normal. Pupils equal.  Neck: Supple. No mass. No thyroid mass. Lymph Nodes:  No supraclavicular, cervical, or axillary nodes.  Lungs: Clear to auscultation and symmetric breath sounds. Heart: RRR. No murmur or rub.  Breasts: Right: No mass or nodule Left: Old circumareolar incision. She has a bruise at 5 o'clock. There is a mass effect in the area, but this is probably just her bruise.  Abdomen: Soft. No mass. No tenderness. No hernia. Normal bowel sounds.  Extremities: Good strength and ROM in upper and lower extremities.  Neurologic: Grossly intact to motor and sensory function. Psychiatric: Has normal mood and affect. Behavior is normal.  Assessment & Plan  1.  BREAST CANCER, STAGE 1, LEFT (174.9  C50.912)  Story: Left breast at the 4 o'clock position on 10 August 2014.  Accession: [id number] - invasive ductal carcinoma, ER 100%, PR 30%, HER-2nue negative, Ki-67 3%.  Impression: Plan   Plan MRI of breasts because of ca++ adjacent to left breast cancer   See medical oncology and rad oncology   Schedule left breast lumpectomy (seed vs wires) and left axillary sentinel lymph node biopsy. (This may change depending on MRI)  2. 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 Fredirick Maudlin 3. Smoked, but quit at age 17 4. History of pilonidal 5. Has lost 30 pounds to control her BP  6. She is vegan and tries to control her health through her diet.   Alphonsa Overall, MD, [facility] Pager: [phone] Office phone:  [phone]

## 2014-09-23 ENCOUNTER — Encounter (HOSPITAL_BASED_OUTPATIENT_CLINIC_OR_DEPARTMENT_OTHER): Payer: Self-pay | Admitting: Certified Registered"

## 2014-09-23 ENCOUNTER — Encounter (HOSPITAL_COMMUNITY)
Admission: RE | Admit: 2014-09-23 | Discharge: 2014-09-23 | Disposition: A | Discharge status: Home / Self Care | Payer: 59 | Source: Ambulatory Visit | Attending: Surgery | Admitting: Surgery

## 2014-09-23 ENCOUNTER — Ambulatory Visit (HOSPITAL_BASED_OUTPATIENT_CLINIC_OR_DEPARTMENT_OTHER): Payer: 59 | Admitting: Certified Registered"

## 2014-09-23 ENCOUNTER — Ambulatory Visit (HOSPITAL_BASED_OUTPATIENT_CLINIC_OR_DEPARTMENT_OTHER)
Admission: RE | Admit: 2014-09-23 | Discharge: 2014-09-23 | Disposition: A | Discharge status: Home / Self Care | Payer: 59 | Source: Ambulatory Visit | Attending: Surgery | Admitting: Surgery

## 2014-09-23 ENCOUNTER — Ambulatory Visit
Admission: RE | Admit: 2014-09-23 | Discharge: 2014-09-23 | Disposition: A | Discharge status: Home / Self Care | Payer: 59 | Source: Ambulatory Visit | Attending: Surgery | Admitting: Surgery

## 2014-09-23 ENCOUNTER — Ambulatory Visit
Admit: 2014-09-23 | Discharge: 2014-09-23 | Disposition: A | Discharge status: Home / Self Care | Payer: 59 | Attending: Surgery | Admitting: Surgery

## 2014-09-23 ENCOUNTER — Encounter (HOSPITAL_BASED_OUTPATIENT_CLINIC_OR_DEPARTMENT_OTHER)
Admission: RE | Disposition: A | Discharge status: Home / Self Care | Payer: Self-pay | Source: Ambulatory Visit | Attending: Surgery

## 2014-09-23 ENCOUNTER — Encounter: Payer: 59 | Admitting: Family Medicine

## 2014-09-23 DIAGNOSIS — Z87891 Personal history of nicotine dependence: Secondary | ICD-10-CM | POA: Diagnosis not present

## 2014-09-23 DIAGNOSIS — Z8249 Family history of ischemic heart disease and other diseases of the circulatory system: Secondary | ICD-10-CM | POA: Diagnosis not present

## 2014-09-23 DIAGNOSIS — C50912 Malignant neoplasm of unspecified site of left female breast: Secondary | ICD-10-CM

## 2014-09-23 DIAGNOSIS — D0512 Intraductal carcinoma in situ of left breast: Secondary | ICD-10-CM | POA: Diagnosis not present

## 2014-09-23 HISTORY — PX: BREAST LUMPECTOMY WITH NEEDLE LOCALIZATION AND AXILLARY SENTINEL LYMPH NODE BX: SHX5760

## 2014-09-23 SURGERY — BREAST LUMPECTOMY WITH NEEDLE LOCALIZATION AND AXILLARY SENTINEL LYMPH NODE BX
Anesthesia: Regional | Site: Chest | Laterality: Left

## 2014-09-23 MED ORDER — DEXAMETHASONE SODIUM PHOSPHATE 4 MG/ML IJ SOLN
INTRAMUSCULAR | Status: DC | PRN
  Administered 2014-09-23: 7 mg via INTRAVENOUS

## 2014-09-23 MED ORDER — PROPOFOL 10 MG/ML IV BOLUS
INTRAVENOUS | Status: DC | PRN
  Administered 2014-09-23: 140 mg via INTRAVENOUS

## 2014-09-23 MED ORDER — BUPIVACAINE-EPINEPHRINE 0.5% -1:200000 IJ SOLN
INTRAMUSCULAR | Status: DC | PRN
  Administered 2014-09-23: 20 mL

## 2014-09-23 MED ORDER — FENTANYL CITRATE (PF) 100 MCG/2ML IJ SOLN
INTRAMUSCULAR | Status: AC
  Filled 2014-09-23: qty 2

## 2014-09-23 MED ORDER — FENTANYL CITRATE (PF) 100 MCG/2ML IJ SOLN
INTRAMUSCULAR | Status: AC
  Filled 2014-09-23: qty 6

## 2014-09-23 MED ORDER — CIPROFLOXACIN IN D5W 400 MG/200ML IV SOLN
400.0000 mg | INTRAVENOUS | Status: AC
  Administered 2014-09-23: 400 mg via INTRAVENOUS

## 2014-09-23 MED ORDER — PROPOFOL 500 MG/50ML IV EMUL
INTRAVENOUS | Status: AC
  Filled 2014-09-23: qty 50

## 2014-09-23 MED ORDER — CHLORHEXIDINE GLUCONATE 4 % EX LIQD: 1.0000 "application " | Freq: Once | CUTANEOUS | Status: DC

## 2014-09-23 MED ORDER — HYDROMORPHONE HCL 1 MG/ML IJ SOLN: 0.2500 mg | INTRAMUSCULAR | Status: DC | PRN

## 2014-09-23 MED ORDER — ONDANSETRON HCL 4 MG/2ML IJ SOLN: 4.0000 mg | Freq: Four times a day (QID) | INTRAMUSCULAR | Status: DC | PRN

## 2014-09-23 MED ORDER — SCOPOLAMINE 1 MG/3DAYS TD PT72: 1.0000 | MEDICATED_PATCH | Freq: Once | TRANSDERMAL | Status: DC | PRN

## 2014-09-23 MED ORDER — LIDOCAINE HCL (CARDIAC) 20 MG/ML IV SOLN
INTRAVENOUS | Status: DC | PRN
  Administered 2014-09-23: 60 mg via INTRAVENOUS

## 2014-09-23 MED ORDER — MIDAZOLAM HCL 2 MG/2ML IJ SOLN
INTRAMUSCULAR | Status: AC
  Filled 2014-09-23: qty 2

## 2014-09-23 MED ORDER — MIDAZOLAM HCL 2 MG/2ML IJ SOLN
1.0000 mg | INTRAMUSCULAR | Status: DC | PRN
  Administered 2014-09-23: 2 mg via INTRAVENOUS
  Administered 2014-09-23 (×2): 1 mg via INTRAVENOUS

## 2014-09-23 MED ORDER — TECHNETIUM TC 99M SULFUR COLLOID FILTERED
1.0000 | Freq: Once | INTRAVENOUS | Status: AC | PRN
  Administered 2014-09-23: 1 via INTRADERMAL

## 2014-09-23 MED ORDER — OXYCODONE HCL 5 MG PO TABS: 5.0000 mg | ORAL_TABLET | Freq: Once | ORAL | Status: DC | PRN

## 2014-09-23 MED ORDER — CIPROFLOXACIN IN D5W 400 MG/200ML IV SOLN
INTRAVENOUS | Status: AC
  Filled 2014-09-23: qty 200

## 2014-09-23 MED ORDER — FENTANYL CITRATE (PF) 100 MCG/2ML IJ SOLN
50.0000 ug | INTRAMUSCULAR | Status: AC | PRN
  Administered 2014-09-23: 50 ug via INTRAVENOUS
  Administered 2014-09-23: 100 ug via INTRAVENOUS
  Administered 2014-09-23: 50 ug via INTRAVENOUS

## 2014-09-23 MED ORDER — GLYCOPYRROLATE 0.2 MG/ML IJ SOLN: 0.2000 mg | Freq: Once | INTRAMUSCULAR | Status: DC | PRN

## 2014-09-23 MED ORDER — LACTATED RINGERS IV SOLN
INTRAVENOUS | Status: DC
  Administered 2014-09-23: 12:00:00 via INTRAVENOUS

## 2014-09-23 MED ORDER — CEFAZOLIN SODIUM-DEXTROSE 2-3 GM-% IV SOLR: 2.0000 g | INTRAVENOUS | Status: DC

## 2014-09-23 MED ORDER — BUPIVACAINE-EPINEPHRINE (PF) 0.5% -1:200000 IJ SOLN
INTRAMUSCULAR | Status: DC | PRN
  Administered 2014-09-23: 20 mL via PERINEURAL

## 2014-09-23 MED ORDER — OXYCODONE HCL 5 MG/5ML PO SOLN: 5.0000 mg | Freq: Once | ORAL | Status: DC | PRN

## 2014-09-23 MED ORDER — ONDANSETRON HCL 4 MG/2ML IJ SOLN
INTRAMUSCULAR | Status: DC | PRN
  Administered 2014-09-23: 4 mg via INTRAVENOUS

## 2014-09-23 MED ORDER — HYDROCODONE-ACETAMINOPHEN 5-325 MG PO TABS: 1.0000 | ORAL_TABLET | Freq: Four times a day (QID) | ORAL | Status: DC | PRN

## 2014-09-23 MED ORDER — EPHEDRINE SULFATE 50 MG/ML IJ SOLN
INTRAMUSCULAR | Status: DC | PRN
  Administered 2014-09-23: 10 mg via INTRAVENOUS
  Administered 2014-09-23: 5 mg via INTRAVENOUS

## 2014-09-23 SURGICAL SUPPLY — 53 items
APPLIER CLIP 11 MED OPEN (CLIP)
BINDER BREAST LRG (GAUZE/BANDAGES/DRESSINGS) IMPLANT
BINDER BREAST MEDIUM (GAUZE/BANDAGES/DRESSINGS) ×2 IMPLANT
BINDER BREAST XLRG (GAUZE/BANDAGES/DRESSINGS) IMPLANT
BINDER BREAST XXLRG (GAUZE/BANDAGES/DRESSINGS) IMPLANT
BLADE SURG 10 STRL SS (BLADE) ×2 IMPLANT
BLADE SURG 15 STRL LF DISP TIS (BLADE) ×1 IMPLANT
BLADE SURG 15 STRL SS (BLADE) ×1
CANISTER SUCT 1200ML W/VALVE (MISCELLANEOUS) ×2 IMPLANT
CHLORAPREP W/TINT 26ML (MISCELLANEOUS) ×2 IMPLANT
CLIP APPLIE 11 MED OPEN (CLIP) IMPLANT
CLIP TI WIDE RED SMALL 6 (CLIP) ×2 IMPLANT
COVER BACK TABLE 60X90IN (DRAPES) ×2 IMPLANT
COVER MAYO STAND STRL (DRAPES) ×2 IMPLANT
COVER PROBE W GEL 5X96 (DRAPES) ×2 IMPLANT
DECANTER SPIKE VIAL GLASS SM (MISCELLANEOUS) IMPLANT
DEVICE DUBIN W/COMP PLATE 8390 (MISCELLANEOUS) ×2 IMPLANT
DRAPE LAPAROSCOPIC ABDOMINAL (DRAPES) ×2 IMPLANT
DRAPE UTILITY XL STRL (DRAPES) ×2 IMPLANT
ELECT COATED BLADE 2.86 ST (ELECTRODE) ×2 IMPLANT
ELECT REM PT RETURN 9FT ADLT (ELECTROSURGICAL) ×2
ELECTRODE REM PT RTRN 9FT ADLT (ELECTROSURGICAL) ×1 IMPLANT
GAUZE SPONGE 4X4 12PLY STRL (GAUZE/BANDAGES/DRESSINGS) IMPLANT
GLOVE BIO SURGEON STRL SZ 6.5 (GLOVE) ×2 IMPLANT
GLOVE BIOGEL PI IND STRL 7.0 (GLOVE) ×2 IMPLANT
GLOVE BIOGEL PI INDICATOR 7.0 (GLOVE) ×2
GLOVE SURG SIGNA 7.5 PF LTX (GLOVE) ×4 IMPLANT
GOWN STRL REUS W/ TWL LRG LVL3 (GOWN DISPOSABLE) ×1 IMPLANT
GOWN STRL REUS W/ TWL XL LVL3 (GOWN DISPOSABLE) ×1 IMPLANT
GOWN STRL REUS W/TWL LRG LVL3 (GOWN DISPOSABLE) ×1
GOWN STRL REUS W/TWL XL LVL3 (GOWN DISPOSABLE) ×1
KIT MARKER MARGIN INK (KITS) IMPLANT
LIQUID BAND (GAUZE/BANDAGES/DRESSINGS) ×2 IMPLANT
NDL SAFETY ECLIPSE 18X1.5 (NEEDLE) IMPLANT
NEEDLE HYPO 18GX1.5 SHARP (NEEDLE)
NEEDLE HYPO 25X1 1.5 SAFETY (NEEDLE) ×2 IMPLANT
NS IRRIG 1000ML POUR BTL (IV SOLUTION) ×2 IMPLANT
PACK BASIN DAY SURGERY FS (CUSTOM PROCEDURE TRAY) ×2 IMPLANT
PAD ALCOHOL SWAB (MISCELLANEOUS) IMPLANT
PENCIL BUTTON HOLSTER BLD 10FT (ELECTRODE) ×2 IMPLANT
SHEET MEDIUM DRAPE 40X70 STRL (DRAPES) ×2 IMPLANT
SLEEVE SCD COMPRESS KNEE MED (MISCELLANEOUS) ×2 IMPLANT
SPONGE GAUZE 4X4 12PLY STER LF (GAUZE/BANDAGES/DRESSINGS) IMPLANT
SPONGE LAP 18X18 X RAY DECT (DISPOSABLE) ×2 IMPLANT
SUT MON AB 5-0 PS2 18 (SUTURE) ×2 IMPLANT
SUT SILK 3 0 TIES 17X18 (SUTURE)
SUT SILK 3-0 18XBRD TIE BLK (SUTURE) IMPLANT
SUT VICRYL 3-0 CR8 SH (SUTURE) ×2 IMPLANT
SYR CONTROL 10ML LL (SYRINGE) ×4 IMPLANT
TOWEL OR 17X24 6PK STRL BLUE (TOWEL DISPOSABLE) ×2 IMPLANT
TOWEL OR NON WOVEN STRL DISP B (DISPOSABLE) ×2 IMPLANT
TUBE CONNECTING 20X1/4 (TUBING) ×2 IMPLANT
YANKAUER SUCT BULB TIP NO VENT (SUCTIONS) ×2 IMPLANT

## 2014-09-23 NOTE — Transfer of Care (Signed)
Immediate Anesthesia Transfer of Care Note  Patient: Catherine Monroe  Procedure(s) Performed: Procedure(s): LEFT BREAST LUMPECTOMY WITH BRACKETED  NEEDLE LOCALIZATION AND LEFT AXILLARY SENTINEL LYMPH NODE BX (Left)  Patient Location: PACU  Anesthesia Type:GA combined with regional for post-op pain  Level of Consciousness: awake, alert , oriented and patient cooperative  Airway & Oxygen Therapy: Patient Spontanous Breathing and Patient connected to face mask oxygen  Post-op Assessment: Report given to RN, Post -op Vital signs reviewed and stable and Patient moving all extremities  Post vital signs: Reviewed and stable  Last Vitals:  Filed Vitals:   09/23/14 1345  BP:   Pulse: 64  Temp:   Resp: 19    Complications: No apparent anesthesia complications

## 2014-09-23 NOTE — Discharge Instructions (Signed)
[facility] - DISCHARGE INSTRUCTIONS TO PATIENT   Activity:  Driving - May drive in 2 or 3 days   Lifting - No lifting more than 15 pounds for one week, then no limit  Wound Care:   Leave incision dry for 3 days, then may shower  Diet:  As tolerated  Follow up appointment:  Call Dr. Pollie Friar office [facility]) at [phone] for an appointment in 2 to 3 weeks  Medications and dosages:  Resume your home medications.  You have a prescription for:  Vicodin  Call Dr. Lucia Gaskins or his office  [phone]) if you have:  Temperature greater than 100.4,  Persistent nausea and vomiting,  Severe uncontrolled pain,  Redness, tenderness, or signs of infection (pain, swelling, redness, odor or green/yellow discharge around the site),  Difficulty breathing, headache or visual disturbances,  Any other questions or concerns you may have after discharge.  In an emergency, call 911 or go to an Emergency Department at a nearby hospital.    Post Anesthesia Home Care Instructions  Activity: Get plenty of rest for the remainder of the day. A responsible adult should stay with you for 24 hours following the procedure.  For the next 24 hours, DO NOT: -Drive a car -Paediatric nurse -Drink alcoholic beverages -Take any medication unless instructed by your physician -Make any legal decisions or sign important papers.  Meals: Start with liquid foods such as gelatin or soup. Progress to regular foods as tolerated. Avoid greasy, spicy, heavy foods. If nausea and/or vomiting occur, drink only clear liquids until the nausea and/or vomiting subsides. Call your physician if vomiting continues.  Special Instructions/Symptoms: Your throat may feel dry or sore from the anesthesia or the breathing tube placed in your throat during surgery. If this causes discomfort, gargle with warm salt water. The discomfort should disappear within 24 hours.  If you had a scopolamine patch placed  behind your ear for the management of post- operative nausea and/or vomiting:  1. The medication in the patch is effective for 72 hours, after which it should be removed.  Wrap patch in a tissue and discard in the trash. Wash hands thoroughly with soap and water. 2. You may remove the patch earlier than 72 hours if you experience unpleasant side effects which may include dry mouth, dizziness or visual disturbances. 3. Avoid touching the patch. Wash your hands with soap and water after contact with the patch.

## 2014-09-23 NOTE — Interval H&P Note (Signed)
History and Physical Interval Note:  09/23/2014 1:19 PM  Catherine Monroe  has presented today for surgery, with the diagnosis of left breast cancer  The various methods of treatment have been discussed with the patient and family.   Has wire locs.  Husband, Truman Hayward, is with her.  After consideration of risks, benefits and other options for treatment, the patient has consented to  Procedure(s): LEFT BREAST LUMPECTOMY WITH BRACKETED  NEEDLE LOCALIZATION AND LEFT AXILLARY SENTINEL LYMPH NODE BX (Left) as a surgical intervention .  The patient's history has been reviewed, patient examined, no change in status, stable for surgery.  I have reviewed the patient's chart and labs.  Questions were answered to the patient's satisfaction.     Lashawn Orrego H

## 2014-09-23 NOTE — Op Note (Signed)
09/23/2014  3:33 PM  PATIENT:  Catherine Monroe DOB: 05/04/1960 MRN: [id number]  PREOP DIAGNOSIS:  left breast cancer  POSTOP DIAGNOSIS:   Left breast cancer, 3 o'clock position (T1, N0)  PROCEDURE:   Procedure(s):  LEFT BREAST LUMPECTOMY WITH BRACKETED  NEEDLE LOCALIZATION (2 needles) AND LEFT AXILLARY SENTINEL LYMPH NODE BX  SURGEON:   David Newman, M.D.  ANESTHESIA:   general  Anesthesiologist: Adam Hodierne, MD; David Crews, MD CRNA: Janet W Craft, CRNA  General  EBL:  100  ml  DRAINS: none   LOCAL MEDICATIONS USED:   20 cc 1/2% marcaine, pectoral block  SPECIMEN:   Left breast lumpectomy (2 wires),  Medial border (note: the colors were switched - orange - medial and yellow - lateral), lateral border (suture inferior), left axillary sentinel lymph node (counts of 700, background 30)  COUNTS CORRECT:  YES  INDICATIONS FOR PROCEDURE:  Catherine Monroe is a 54 y.o. (DOB: 11/18/1960) white  female whose primary care physician is METHENEY,CATHERINE, MD and comes for left breast lumpectomy and left axillary sentinel lymph node biopsy.   She has seen Drs. Gudena and Wentworth from oncology.  She has an invasive ductal ca at 3 o'clock with a radial area of microcalcifications that measures 5 cm.  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  The plan is to "bracket" the invasive cancer and the microcalcifications.    Options for breast cancer treatment were discussed with the patient. She elected to proceed with lumpectomy and axillary sentinel lymph node.     The indications and potential complications of surgery were explained to the patient. Potential complications include, but are not limited to, bleeding, infection, the need for further surgery, and nerve injury.     The patient had two needle loc wire placed at the 3 o'clock position of the left breast at [facility] by Dr. T. Lawrence.    The two wires were radially placed and about 6 cm apart.  In the holding area, her left areola was  injected with 1 millicurie of Technitium Sulfur Colloid.  OPERATIVE NOTE:   The patient was taken to room # 8 at [facility] where she underwent a general anesthesia  supervised by Anesthesiologist: Adam Hodierne, MD; David Crews, MD CRNA: Janet W Craft, CRNA. Her left breast and axilla were prepped with  ChloraPrep and sterilely draped.    A time-out and the surgical check list was reviewed.    Because there were two wires in place, I started with the cancer which was about at the 3 o'clock position of the left breast. I cut down around the two wires and tried to take an ellipse of breast tissue around the tumor by at least 1 cm.  My incision was radial to the nipple.   I excised this block of breast tissue approximately 6 cm by 6 cm  in diameter. I did take the dissection down to the pectoralis major. I painted the lumpectomy specimen with the 6 color paint kit and did a specimen mammogram which confirmed the mass, clip and the 2 wires were all in the right position.    I took an additional medial margin (I mislabeled this with the paint kit.  The orange is medial and the yellow is lateral) and I took an additional lateral margin (the suture marked the inferior margin).   Of note, there is no further inferior margin (it is the skin over the biopsy) and there is no further lateral margin (  it is the skin over the biopsy cavity).  I went to the chest wall for the full extent of the excision.  The is really more a quadrantectomy of the left breast for this patient.   I then did the left axillary sentinel lymph node biopsy. I made an incision in the left axilla.  I found a hot area at the junction of the breast and the pectoralis major muscle. I cut down and  identified a hot node that had counts of 700 and the background has 30 counts. The lymph node was blue. I checked her internal mammary nodes and supraclavicular nodes with the neoprobe and found no other hot area. The axillary node was then sent  to pathology.    I then irrigated the wound with saline. I infiltrated approximately 30 mL of 1% local between the  Incisions.  I placed 6 clips to mark biopsy cavity, at 12, 3, 6, and 9 o'clock.  I then closed all the wounds in layers using 3-0 Vicryl sutures for the deep layer. At the skin, I closed the incisions with a 5-0 Monocryl suture. The incisions were then painted with Dermabond.  She had gauze place over the wounds and placed in a breast binder.   The patient tolerated the procedure well, was transported to the recovery room in good condition. Sponge and needle count were correct at the end of the case.   Final pathology is pending.   Alphonsa Overall, MD, [facility] Pager: [phone] Office phone:  [phone]

## 2014-09-23 NOTE — Progress Notes (Signed)
Assisted Dr. Marcie Bal with left, ultrasound guided, pectoralis block. Side rails up, monitors on throughout procedure. See vital signs in flow sheet. Tolerated Procedure well.

## 2014-09-23 NOTE — Anesthesia Procedure Notes (Addendum)
Anesthesia Regional Block:  Pectoralis block  Pre-Anesthetic Checklist: ,, timeout performed, Correct Patient, Correct Site, Correct Laterality, Correct Procedure, Correct Position, site marked, Risks and benefits discussed,  Surgical consent,  Pre-op evaluation,  At surgeon's request and post-op pain management  Laterality: Left  Prep: chloraprep       Needles:  Injection technique: Single-shot  Needle Type: Echogenic Needle     Needle Length: 9cm 9 cm Needle Gauge: 21 and 21 G    Additional Needles:  Procedures: ultrasound guided (picture in chart) Pectoralis block Narrative:  Start time: 09/23/2014 12:11 PM End time: 09/23/2014 12:20 PM Injection made incrementally with aspirations every 5 mL.  Performed by: Personally  Anesthesiologist: HODIERNE, ADAM  Additional Notes: Pt tolerated the procedure well.   Procedure Name: LMA Insertion Date/Time: 09/23/2014 2:05 PM Performed by: Baxter Flattery Pre-anesthesia Checklist: Patient identified, Emergency Drugs available, Suction available and Patient being monitored Patient Re-evaluated:Patient Re-evaluated prior to inductionOxygen Delivery Method: Circle System Utilized Preoxygenation: Pre-oxygenation with 100% oxygen Intubation Type: IV induction Ventilation: Mask ventilation without difficulty LMA: LMA inserted LMA Size: 3.0 Number of attempts: 1 Airway Equipment and Method: Bite block Placement Confirmation: positive ETCO2 and breath sounds checked- equal and bilateral Tube secured with: Tape Dental Injury: Teeth and Oropharynx as per pre-operative assessment

## 2014-09-23 NOTE — Anesthesia Postprocedure Evaluation (Signed)
  Anesthesia Post-op Note  Patient: Catherine Monroe  Procedure(s) Performed: Procedure(s): LEFT BREAST LUMPECTOMY WITH BRACKETED  NEEDLE LOCALIZATION AND LEFT AXILLARY SENTINEL LYMPH NODE BX (Left)  Patient Location: PACU  Anesthesia Type: General, Regional   Level of Consciousness: awake, alert  and oriented  Airway and Oxygen Therapy: Patient Spontanous Breathing  Post-op Pain: none  Post-op Assessment: Post-op Vital signs reviewed  Post-op Vital Signs: Reviewed  Last Vitals:  Filed Vitals:   09/23/14 1648  BP: 134/81  Pulse: 62  Temp: 36.7 C  Resp: 18    Complications: No apparent anesthesia complications

## 2014-09-23 NOTE — Anesthesia Preprocedure Evaluation (Signed)
Anesthesia Evaluation  Patient identified by MRN, date of birth, ID band Patient awake    Reviewed: Allergy & Precautions, NPO status , Patient's Chart, lab work & pertinent test results  Airway Mallampati: II   Neck ROM: full    Dental   Pulmonary former smoker,  breath sounds clear to auscultation        Cardiovascular hypertension, Rhythm:regular Rate:Normal     Neuro/Psych    GI/Hepatic   Endo/Other    Renal/GU      Musculoskeletal   Abdominal   Peds  Hematology   Anesthesia Other Findings   Reproductive/Obstetrics                             Anesthesia Physical Anesthesia Plan  ASA: II  Anesthesia Plan: General and Regional   Post-op Pain Management:    Induction: Intravenous  Airway Management Planned: LMA  Additional Equipment:   Intra-op Plan:   Post-operative Plan:   Informed Consent: I have reviewed the patients History and Physical, chart, labs and discussed the procedure including the risks, benefits and alternatives for the proposed anesthesia with the patient or authorized representative who has indicated his/her understanding and acceptance.     Plan Discussed with: CRNA, Anesthesiologist and Surgeon  Anesthesia Plan Comments:         Anesthesia Quick Evaluation

## 2014-09-23 NOTE — Progress Notes (Signed)
nuc med inj performed by radiology staff. Pt tol well with versed and fentanyl. VSS

## 2014-09-26 ENCOUNTER — Encounter (HOSPITAL_BASED_OUTPATIENT_CLINIC_OR_DEPARTMENT_OTHER): Payer: Self-pay | Admitting: Surgery

## 2014-10-05 ENCOUNTER — Encounter: Payer: Self-pay | Admitting: Hematology and Oncology

## 2014-10-05 ENCOUNTER — Ambulatory Visit (HOSPITAL_BASED_OUTPATIENT_CLINIC_OR_DEPARTMENT_OTHER): Payer: 59 | Admitting: Hematology and Oncology

## 2014-10-05 VITALS — BP 158/73 | HR 60 | Temp 97.9°F | Resp 18 | Ht 61.5 in | Wt 108.7 lb

## 2014-10-05 DIAGNOSIS — C50512 Malignant neoplasm of lower-outer quadrant of left female breast: Secondary | ICD-10-CM | POA: Diagnosis not present

## 2014-10-05 DIAGNOSIS — Z170 Estrogen receptor positive status [ER+]: Secondary | ICD-10-CM | POA: Diagnosis not present

## 2014-10-05 NOTE — Addendum Note (Signed)
Addended by: Prentiss Bells on: 10/05/2014 09:16 AM   Modules accepted: Medications

## 2014-10-05 NOTE — Progress Notes (Signed)
Patient Care Team: Hali Marry, MD as PCP - General Thea Silversmith, MD as Consulting Physician (Radiation Oncology) Nicholas Lose, MD as Consulting Physician (Hematology and Oncology)  DIAGNOSIS: No matching staging information was found for the patient.  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 She had a seroma which was drained yesterday. She is still fairly sore from that. She is anxious to hear what my opinion is regarding the micrometastatic disease.  REVIEW OF SYSTEMS:   Constitutional: Denies fevers, chills or abnormal weight loss Eyes: Denies blurriness of vision Ears, nose, mouth, throat, and face: Denies mucositis or sore throat Respiratory: Denies cough, dyspnea or wheezes Cardiovascular: Denies palpitation, chest discomfort or lower extremity swelling Gastrointestinal:  Denies nausea, heartburn or change in bowel habits Skin: Denies abnormal skin rashes Lymphatics: Denies new lymphadenopathy or easy  bruising Neurological:Denies numbness, tingling or new weaknesses Behavioral/Psych: Mood is stable, no new changes  Breast: Discomfort from recent breast surgery All other systems were reviewed with the patient and are negative.  I have reviewed the past medical history, past surgical history, social history and family history with the patient and they are unchanged from previous note.  ALLERGIES:  is allergic to penicillins.  MEDICATIONS:  Current Outpatient Prescriptions  Medication Sig Dispense Refill  . HYDROcodone-acetaminophen (NORCO/VICODIN) 5-325 MG per tablet Take 1-2 tablets by mouth every 6 (six) hours as needed. 30 tablet 0  . Magnesium 100 MG CAPS Take 4 capsules by mouth daily.     . Melatonin 2.5 MG CAPS Take 1 capsule by mouth at bedtime.      No current facility-administered medications for this visit.    PHYSICAL EXAMINATION: ECOG PERFORMANCE STATUS: 1 - Symptomatic but completely ambulatory  Filed Vitals:   10/05/14 0835  BP: 158/73  Pulse: 60  Temp: 97.9 F (36.6 C)  Resp: 18   Filed Weights   10/05/14 0835  Weight: 108 lb 11.2 oz (49.306 kg)    GENERAL:alert, no distress and comfortable SKIN: skin color, texture, turgor are normal, no rashes or significant lesions EYES: normal, Conjunctiva are pink and non-injected, sclera clear OROPHARYNX:no exudate, no erythema and lips, buccal mucosa, and tongue normal  NECK: supple, thyroid normal size, non-tender, without nodularity LYMPH:  no palpable lymphadenopathy in the cervical, axillary or inguinal LUNGS: clear to auscultation and percussion with normal breathing effort HEART: regular rate & rhythm and no murmurs and no lower extremity edema ABDOMEN:abdomen soft, non-tender and normal bowel sounds Musculoskeletal:no cyanosis of digits and no clubbing  NEURO: alert & oriented x 3 with fluent speech, no focal motor/sensory deficits  LABORATORY DATA:  I have reviewed the data  as listed   Chemistry       Component Value Date/Time   NA 144 09/08/2014 1125   K 3.9 09/08/2014 1125   CL 107 09/08/2014 1125   CO2 27 09/08/2014 1125   BUN <5* 09/08/2014 1125   CREATININE 0.75 09/08/2014 1125   CREATININE 0.59 02/23/2013 1535      Component Value Date/Time   CALCIUM 9.4 09/08/2014 1125   ALKPHOS 67 02/23/2013 1535   AST 21 02/23/2013 1535   ALT 30 02/23/2013 1535   BILITOT 0.8 02/23/2013 1535       Lab Results  Component Value Date   WBC 4.9 09/08/2014   HGB 14.1 09/08/2014   HCT 43.0 09/08/2014   MCV 92.3 09/08/2014   PLT 181 09/08/2014   ASSESSMENT & PLAN:  Breast cancer of lower-outer quadrant of left female breast Left Lumpectomy 09/23/2014: IDC 1.1 cm, with Multifocal DCIS, 1 LN Micromets, margins neg T1CN30mic (Stage 1B) ER 100%, PR 30%, HER-2/neu negative, Ki-67 3%, grade 1  Pathology review: I discussed the final pathology report and provided her with a copy of this report. The final tumor was 1.1 cm. Margins were all negative. I discussed with her the difference between micrometastatic disease and metastatic disease. Based upon low-grade and low Ki-67, I anticipate that her risk of breast cancer recurrence would be low. In order to determine the benefit of chemotherapy, I recommended Oncotype DX testing  Recommendation: 1. Oncotype DX testing to determine chemotherapy benefit 2. Followed by adjuvant radiation 3. Followed by adjuvant antiestrogen therapy   Survivorship: Patient has lost 40 pounds by eating healthy and feels that if her risk of recurrence is quite low, she might even consider not taking tamoxifen. She is worried that tamoxifen would impair her quality of life especially because her arthritis her weight gain issues.  Return to clinic based Oncotype DX score   No orders of the defined types were placed in this encounter.   The patient has a good understanding of the overall plan. she agrees with it. she will call with any problems that may develop before  the next visit here.   Rulon Eisenmenger, MD

## 2014-10-05 NOTE — Assessment & Plan Note (Signed)
Left Lumpectomy 09/23/2014: IDC 1.1 cm, with Multifocal DCIS, 1 LN Micromets, margins neg T1CN28mc (Stage 1B) ER 100%, PR 30%, HER-2/neu negative, Ki-67 3%, grade 1  Pathology review: I discussed the final pathology report and provided her with a copy of this report. The final tumor was 1.1 cm. Margins were all negative. I discussed with her the difference between micrometastatic disease and metastatic disease. Based upon low-grade and low Ki-67, I anticipate that her risk of breast cancer recurrence would be low. In order to determine the benefit of chemotherapy, I recommended Oncotype DX testing  Recommendation: 1. Oncotype DX testing to determine chemotherapy benefit 2. Followed by adjuvant radiation 3. Followed by adjuvant antiestrogen therapy   Return to clinic based Oncotype DX score

## 2014-10-06 ENCOUNTER — Ambulatory Visit (INDEPENDENT_AMBULATORY_CARE_PROVIDER_SITE_OTHER): Payer: 59 | Admitting: Family Medicine

## 2014-10-06 ENCOUNTER — Encounter: Payer: Self-pay | Admitting: Family Medicine

## 2014-10-06 ENCOUNTER — Other Ambulatory Visit (HOSPITAL_COMMUNITY)
Admission: RE | Admit: 2014-10-06 | Discharge: 2014-10-06 | Disposition: A | Discharge status: Home / Self Care | Payer: 59 | Source: Ambulatory Visit | Attending: Family Medicine | Admitting: Family Medicine

## 2014-10-06 ENCOUNTER — Encounter: Payer: Self-pay | Admitting: *Deleted

## 2014-10-06 VITALS — BP 138/82 | HR 62 | Temp 98.2°F | Ht 64.5 in | Wt 107.0 lb

## 2014-10-06 DIAGNOSIS — Z0000 Encounter for general adult medical examination without abnormal findings: Secondary | ICD-10-CM

## 2014-10-06 DIAGNOSIS — Z1151 Encounter for screening for human papillomavirus (HPV): Secondary | ICD-10-CM | POA: Insufficient documentation

## 2014-10-06 DIAGNOSIS — Z01419 Encounter for gynecological examination (general) (routine) without abnormal findings: Secondary | ICD-10-CM | POA: Diagnosis present

## 2014-10-06 DIAGNOSIS — Z114 Encounter for screening for human immunodeficiency virus [HIV]: Secondary | ICD-10-CM

## 2014-10-06 DIAGNOSIS — Z1159 Encounter for screening for other viral diseases: Secondary | ICD-10-CM | POA: Diagnosis not present

## 2014-10-06 NOTE — Progress Notes (Signed)
  Subjective:     Catherine Monroe is a 54 y.o. female and is here for a comprehensive physical exam. The patient reports problems - still having some bruising and swelling snd soreness post breast surgery. Just had a seroma drained underneath the left axilla.  Social History   Social History  . Marital Status: Married    Spouse Name: N/A  . Number of Children: 2  . Years of Education: N/A   Occupational History  . Not on file.   Social History Main Topics  . Smoking status: Former Smoker -- 1.00 packs/day    Types: Cigarettes    Quit date: 01/29/1987  . Smokeless tobacco: Never Used  . Alcohol Use: No  . Drug Use: No  . Sexual Activity:    Partners: Male   Other Topics Concern  . Not on file   Social History Narrative   REgular exercise.    Health Maintenance  Topic Date Due  . Hepatitis C Screening  1960-06-03  . HIV Screening  07/28/1977  . PAP SMEAR  03/19/2014  . INFLUENZA VACCINE  10/06/2015 (Originally 08/29/2014)  . COLONOSCOPY  10/06/2015 (Originally 07/28/2012)  . MAMMOGRAM  09/12/2016  . TETANUS/TDAP  03/19/2021    The following portions of the patient's history were reviewed and updated as appropriate: allergies, current medications, past family history, past medical history, past social history, past surgical history and problem list.  Review of Systems A comprehensive review of systems was negative.   Objective:    BP 138/82 mmHg  Pulse 62  Temp(Src) 98.2 F (36.8 C)  Ht 5' 4.5" (1.638 m)  Wt 107 lb (48.535 kg)  BMI 18.09 kg/m2  LMP 08/31/2011 General appearance: alert, cooperative and appears stated age Head: Normocephalic, without obvious abnormality, atraumatic Eyes: conj clear. EOMI, PEERLA Ears: normal TM's and external ear canals both ears Nose: Nares normal. Septum midline. Mucosa normal. No drainage or sinus tenderness. Throat: lips, mucosa, and tongue normal; teeth and gums normal Neck: no adenopathy, no carotid bruit, no JVD, supple,  symmetrical, trachea midline and thyroid not enlarged, symmetric, no tenderness/mass/nodules Back: symmetric, no curvature. ROM normal. No CVA tenderness. Lungs: clear to auscultation bilaterally Breasts: left breast is swollen, bruise.  incision healing well.  Incision under left axilla is healing. Still some swelling.  Heart: regular rate and rhythm, S1, S2 normal, no murmur, click, rub or gallop Abdomen: soft, non-tender; bowel sounds normal; no masses,  no organomegaly Pelvic: cervix normal in appearance, external genitalia normal, no adnexal masses or tenderness, no cervical motion tenderness, rectovaginal septum normal, uterus normal size, shape, and consistency and vagina normal without discharge Extremities: extremities normal, atraumatic, no cyanosis or edema Pulses: 2+ and symmetric Skin: Skin color, texture, turgor normal. No rashes or lesions Lymph nodes: Cervical adenopathy: nl and Supraclavicular adenopathy: nl Neurologic: Alert and oriented X 3, normal strength and tone. Normal symmetric reflexes. Normal coordination and gait    Assessment:    Healthy female exam.      Plan:     See After Visit Summary for Counseling Recommendations  Keep up a regular exercise program and make sure you are eating a healthy diet Try to eat 4 servings of dairy a day, or if you are lactose intolerant take a calcium with vitamin D daily.  Your vaccines are up to date.  Declined colonoscopy. Will get flu shot at work.  Pap performed today.   Due for Hep C and HIV screening.

## 2014-10-06 NOTE — Progress Notes (Signed)
Ordered oncotype per Dr. Gudena.  Faxed requisition to pathology and confirmed receipt. 

## 2014-10-11 LAB — CYTOLOGY - PAP

## 2014-10-13 ENCOUNTER — Encounter (HOSPITAL_COMMUNITY): Payer: Self-pay

## 2014-10-14 ENCOUNTER — Telehealth: Payer: Self-pay | Admitting: *Deleted

## 2014-10-14 NOTE — Telephone Encounter (Signed)
Received oncotype score of 15/10%. Pt made aware of her results. Request placed for pt to f/u with Dr. Pablo Ledger.

## 2014-10-15 LAB — LIPID PANEL
CHOL/HDL RATIO: 2.6 ratio (ref <=5.0)
CHOLESTEROL: 144 mg/dL (ref 125–200)
HDL: 56 mg/dL (ref >=46)
LDL Cholesterol: 73 mg/dL (ref <130)
TRIGLYCERIDES: 77 mg/dL (ref <150)
VLDL: 15 mg/dL (ref <30)

## 2014-10-15 LAB — HIV ANTIBODY (ROUTINE TESTING W REFLEX): HIV 1&2 Ab, 4th Generation: NONREACTIVE

## 2014-10-15 LAB — HEPATITIS C ANTIBODY: HCV Ab: NEGATIVE

## 2014-10-26 NOTE — Progress Notes (Addendum)
09/23/14 NAL DIAGNOSIS Diagnosis 1. Breast, lumpectomy, Left with two wires - INVASIVE DUCTAL CARCINOMA, 1.1 CM. - MULTIFOCAL DUCTAL CARCINOMA IN SITU. - MARGINS NOT INVOLVED. - FIBROCYSTIC CHANGES WITH CALCIFICATIONS. 2. Lymph node, sentinel, biopsy, Left axillary - MICROMETASTASIS (0.1 CM). IN ONE LYMPH NODE. 3. Breast, excision, additional medial margin - BENIGN BREAST TISSUE WITH FIBROCYSTIC CHANGES. 4. Breast, excision, New lateral margin, suture is inferior - BENIGN BREAST TISSUE WITH FIBROCYSTIC CHANGES.  09/23/14 BREAST LUMPECTOMY WITH NEEDLE LOCALIZATION AND AXILLARY SENTINEL LYMPH NODE BX  Oncotype Score 15.Chemotherapy not reccommended.  BP 150/72 mmHg  Pulse 65  Temp(Src) 98.2 F (36.8 C)  Wt 108 lb 4.8 oz (49.125 kg)  LMP 08/31/2011   PATIENT SCHEDULED FOR VACATION October 14 -October 25.

## 2014-10-27 ENCOUNTER — Ambulatory Visit
Admission: RE | Admit: 2014-10-27 | Discharge: 2014-10-27 | Disposition: A | Discharge status: Home / Self Care | Payer: 59 | Source: Ambulatory Visit | Attending: Radiation Oncology | Admitting: Radiation Oncology

## 2014-10-27 VITALS — BP 150/72 | HR 65 | Temp 98.2°F | Wt 108.3 lb

## 2014-10-27 DIAGNOSIS — C50512 Malignant neoplasm of lower-outer quadrant of left female breast: Secondary | ICD-10-CM

## 2014-10-27 DIAGNOSIS — Z510 Encounter for antineoplastic radiation therapy: Secondary | ICD-10-CM | POA: Diagnosis not present

## 2014-10-27 NOTE — Progress Notes (Addendum)
Department of Radiation Oncology  Phone:  [phone] Fax:        [phone]   Name: Catherine Monroe MRN: [id number]  DOB: [birth year]  Date: 10/27/2014  Follow Up Visit Note  Diagnosis: T1cN1 Level MIC invasive ducal carcinoma of the left female breast  Interval History: Catherine Monroe presents today for routine followup in regards to the management of her cancer of the leftt female (ER positive). Her left breast lumpectomy was completed on 09/23/2014. She had a 1.1cm lesion. This was associated with extensive DCIS and margins were negative. She had a micromet in one lymph node. Her tumor was ER 100% PR 100% KR 67 3%. She met with Dr. Lindi Adie, MD and an oncotype test was ordered. This was low and chemotherapy was not recommended. She is therefore referred for radiation. She is healing well from her most recent surgery. "I have a pain in my bicep. I can put my arm behind my head but it hurts," the patient stated. ROM is not limited. "Pretty good. I think it took about week three, much much better," the patient stated in regards to her symptoms. The patient projected a health mental status and was accompanied by her husband. She questioned whether or not in the oncotype reoccurrence statistics that she had read, if radiation therapy was assumed. "I still have a hematoma from my third biopsy." "The data supporting lumpectomy with or without radiation. 30% is too high," the patient vocalized, referring to her husbands notes. "It looks different. It sits higher up," the patient stated, worried that her breast now appear different then before and may wish to pursue cosmetic solutions in the future. The patient projects anxiety and was accompanied by her husband for today's radiation oncology appointment. She was happy that she can continue her hobby of running and expressed excited over her "100% vegan power" diet. "I'm not one that deals well with the unknown," the patient stated in regards to her anxiety towards  her treatment, which was fully addressed.  Physical Exam: The patient is alert and oriented x3. There is no significant changes to the status of the paients overall health to be noted at this time. Filed Vitals:   10/27/14 0937  BP: 150/72  Pulse: 65  Temp: 98.2 F (36.8 C)  Weight: 108 lb 4.8 oz (49.125 kg)    IMPRESSION: Catherine Monroe is a 54 y.o. female presenting to clinic in regards to her T1cN1 Level MIC invasive ducal carcinoma of the left female breast. She is healing well from surgery. She is appropriately managing current vocalized symptoms, including her new symptom of bicep pain upon stretching and the benefits of pursuing recommended physical therapy to address this symptom. She understands the benefits of the breath hold technique, after specifically vocalizing anxiety as to how this technique would actually "protect" her heart during treatments. The patient was also confused about a CT scan via her husband's notes. She and her husband now understand the purpose, definition, and benefits of a CT scan. She understands that her tattoos given during her simulation treatment plan will be permanent and that there were clips placed via her recent procedure. She confirmed interest in pursuing radiation therapy treatments. She understands that if she is not pleased with the appearance of her breast, cosmetic solutions and referrals to compete such solutaitons are available in the future upon her request. The patient understands that she can access her appointments and medical records via [facility].   PLAN: I spoke to the patient today regarding  her diagnosis and options for treatment. We discussed the equivalence in terms of survival and local failure between mastectomy and breast conservation. We discussed the role of radiation in decreasing local failures in patients who undergo lumpectomy. We discussed the process of simulation and the placement tattoos. We discussed 4 weeks of treatment as an  outpatient. We discussed the possibility of asymptomatic lung damage. We discussed the low likelihood of secondary malignancies. We discussed the possible side effects including but not limited to skin redness, fatigue, permanent skin darkening, and breast swelling. We discussed the use of cardiac sparing with deep inspiration breath hold if needed. The patient understands the benefits, risks, and purpose of radiation therapy were reviewed in detail, including statistics from reputable studies. The patient was educated on healthy methods of management to address these symptoms if they are to occur. The patient was educated on the purpose and definition of a CT scan, simulation planning session, and the appropriate time frame to set aside daily for treatments. She was sent home with additional educational resources that she can access via the Internet. She is aware of her CT scan and simulation planning session to take place as scheduled on Tuesday, October 11th of 2016 at 10:00am. She is aware of her follow-up appointment with radiation oncology to take place as scheduled and to notify Dr. Pablo Ledger, MD if any symptoms of concern occur. In regards to the specific area in which the patient is being treated, symptoms of dry skin "like a sunburn" were explained and she was provided Radiaplex cream to alleviate such symptoms. Physical therapy is recommended to address her newly reported bicep pain. All vocalized questions and concerns have been addressed. If the patient develops any further questions or concerns in regards to her treatment and recovery, she has been encouraged to contact Dr. Pablo Ledger, MD or Dr. Lindi Adie, MD. Consent documentation was reviewed and signed.   This document serves as a record of services personally performed by Thea Silversmith , MD. It was created on her behalf by Lenn Cal, a trained medical scribe. The creation of this record is based on the scribe's personal observations and  the provider's statements to them. This document has been checked and approved by the attending provider.   ------------------------------------------------  Thea Silversmith, MD

## 2014-10-27 NOTE — Progress Notes (Signed)
Please see the Nurse Progress Note in the MD Initial Consult Encounter for this patient. 

## 2014-10-28 NOTE — Addendum Note (Signed)
Encounter addended by: Doreen Beam, RN on: 10/28/2014 12:42 PM<BR>     Documentation filed: Charges VN

## 2014-11-08 ENCOUNTER — Ambulatory Visit
Admission: RE | Admit: 2014-11-08 | Discharge: 2014-11-08 | Disposition: A | Discharge status: Home / Self Care | Payer: 59 | Source: Ambulatory Visit | Attending: Radiation Oncology | Admitting: Radiation Oncology

## 2014-11-08 DIAGNOSIS — C50512 Malignant neoplasm of lower-outer quadrant of left female breast: Secondary | ICD-10-CM

## 2014-11-08 DIAGNOSIS — Z510 Encounter for antineoplastic radiation therapy: Secondary | ICD-10-CM | POA: Diagnosis not present

## 2014-11-08 NOTE — Progress Notes (Signed)
Name: Catherine Monroe   MRN: [id number]  Date:  11/08/2014  DOB: December 09, 1960  Status:outpatient    DIAGNOSIS: Breast cancer.  CONSENT VERIFIED: yes   SET UP: Patient is setup supine   IMMOBILIZATION:  The following immobilization was used:Custom Moldable Pillow, breast board.   NARRATIVE: [name] was brought to the [facility].  Identity was confirmed.  All relevant records and images related to the planned course of therapy were reviewed.  Then, the patient was positioned in a stable reproducible clinical set-up for radiation therapy.  Wires were placed to delineate the clinical extent of breast tissue. A wire was placed on the scar as well.  CT images were obtained.  An isocenter was placed. Skin markings were placed.  The CT images were loaded into the planning software where the target and avoidance structures were contoured.  The radiation prescription was entered and confirmed. The patient was discharged in stable condition and tolerated simulation well.    TREATMENT PLANNING NOTE:  Treatment planning then occurred. I have requested : MLC's, isodose plan, basic dose calculation  I personally designed and supervised the construction of 3 medically necessary complex treatment devices for the protection of critical normal structures including the lungs and contralateral breast as well as the immobilization device which is necessary for set up certainty.   3D simulation occurred. I requested and analyzed a dose volume histogram of the heart, lungs and lumpectomy cavity.

## 2014-11-08 NOTE — Progress Notes (Signed)
Radiation Oncology         [phone] ________________________________  Name: Catherine Monroe      MRN: [id number]          Date: 11/08/2014              DOB: 09/11/1960  Optical Surface Tracking Plan:  Since intensity modulated radiotherapy (IMRT) and 3D conformal radiation treatment methods are predicated on accurate and precise positioning for treatment, intrafraction motion monitoring is medically necessary to ensure accurate and safe treatment delivery.  The ability to quantify intrafraction motion without excessive ionizing radiation dose can only be performed with optical surface tracking. Accordingly, surface imaging offers the opportunity to obtain 3D measurements of patient position throughout IMRT and 3D treatments without excessive radiation exposure.  I am ordering optical surface tracking for this patient's upcoming course of radiotherapy. ________________________________ Signature   Reference:   Ursula Alert, J, et al. Surface imaging-based analysis of intrafraction motion for breast radiotherapy patients.Journal of [place], n. 6, nov. 2014. ISSN 93112162.   Available at: <http://www.jacmp.org/index.php/jacmp/article/view/4957>.

## 2014-11-08 NOTE — Addendum Note (Signed)
Encounter addended by: Thea Silversmith, MD on: 11/08/2014  8:50 AM<BR>     Documentation filed: Notes Section

## 2014-11-08 NOTE — Addendum Note (Signed)
Encounter addended by: Thea Silversmith, MD on: 11/08/2014  8:49 AM<BR>     Documentation filed: Notes Section

## 2014-11-24 DIAGNOSIS — Z510 Encounter for antineoplastic radiation therapy: Secondary | ICD-10-CM | POA: Diagnosis not present

## 2014-11-27 ENCOUNTER — Ambulatory Visit: Payer: 59 | Admitting: Radiation Oncology

## 2014-11-28 ENCOUNTER — Ambulatory Visit
Admission: RE | Admit: 2014-11-28 | Discharge: 2014-11-28 | Disposition: A | Discharge status: Home / Self Care | Payer: 59 | Source: Ambulatory Visit | Attending: Radiation Oncology | Admitting: Radiation Oncology

## 2014-11-28 ENCOUNTER — Ambulatory Visit: Payer: 59 | Admitting: Radiation Oncology

## 2014-11-28 DIAGNOSIS — Z510 Encounter for antineoplastic radiation therapy: Secondary | ICD-10-CM | POA: Diagnosis not present

## 2014-11-29 ENCOUNTER — Encounter: Payer: Self-pay | Admitting: Radiation Oncology

## 2014-11-29 ENCOUNTER — Ambulatory Visit
Admission: RE | Admit: 2014-11-29 | Discharge: 2014-11-29 | Disposition: A | Discharge status: Home / Self Care | Payer: 59 | Source: Ambulatory Visit | Attending: Radiation Oncology | Admitting: Radiation Oncology

## 2014-11-29 VITALS — BP 149/82 | HR 58 | Temp 98.2°F | Resp 16 | Wt 105.9 lb

## 2014-11-29 DIAGNOSIS — C50512 Malignant neoplasm of lower-outer quadrant of left female breast: Secondary | ICD-10-CM | POA: Insufficient documentation

## 2014-11-29 DIAGNOSIS — Z510 Encounter for antineoplastic radiation therapy: Secondary | ICD-10-CM | POA: Diagnosis not present

## 2014-11-29 MED ORDER — RADIAPLEXRX EX GEL
Freq: Once | CUTANEOUS | Status: AC
  Administered 2014-11-29: 10:00:00 via TOPICAL

## 2014-11-29 MED ORDER — ALRA NON-METALLIC DEODORANT (RAD-ONC)
1.0000 "application " | Freq: Once | TOPICAL | Status: AC
  Administered 2014-11-29: 1 via TOPICAL

## 2014-11-29 NOTE — Progress Notes (Signed)
Weekly rad txs 1st/21 completed  patient education done, discussed ways to manage side effects,fatigue, skin irritation,pain,swelling, soreness, increase protein in diet, drink more water stay hydrated, alra and radiaplex gel given, radiation therapy and you book, my business card, verbal understanding, teach back given no pain, appetite good, runs on treadmill daily 30 minutes to 3-106miles   12:42 PM BP 149/82 mmHg  Pulse 58  Temp(Src) 98.2 F (36.8 C) (Oral)  Resp 16  Wt 105 lb 14.4 oz (48.036 kg)  LMP 08/31/2011  Wt Readings from Last 3 Encounters:  11/29/14 105 lb 14.4 oz (48.036 kg)  10/27/14 108 lb 4.8 oz (49.125 kg)  10/06/14 107 lb (48.535 kg)

## 2014-11-29 NOTE — Progress Notes (Addendum)
Weekly Management Note Current Dose: 2.67  Gy  Projected Dose: 52.72 Gy   Narrative:  The patient presents for routine under treatment assessment.  CBCT/MVCT images/Port film x-rays were reviewed.  The chart was checked. Doing well. RN education continues.   Physical Findings: Weight: 105 lb 14.4 oz (48.036 kg). Unchanged skin.   Impression:  The patient is tolerating radiation.  Plan:  Continue treatment as planned.

## 2014-11-30 ENCOUNTER — Ambulatory Visit
Admission: RE | Admit: 2014-11-30 | Discharge: 2014-11-30 | Disposition: A | Discharge status: Home / Self Care | Payer: 59 | Source: Ambulatory Visit | Attending: Radiation Oncology | Admitting: Radiation Oncology

## 2014-11-30 DIAGNOSIS — Z170 Estrogen receptor positive status [ER+]: Secondary | ICD-10-CM | POA: Insufficient documentation

## 2014-11-30 DIAGNOSIS — Z510 Encounter for antineoplastic radiation therapy: Secondary | ICD-10-CM | POA: Diagnosis not present

## 2014-11-30 DIAGNOSIS — C773 Secondary and unspecified malignant neoplasm of axilla and upper limb lymph nodes: Secondary | ICD-10-CM | POA: Diagnosis not present

## 2014-11-30 DIAGNOSIS — C50912 Malignant neoplasm of unspecified site of left female breast: Secondary | ICD-10-CM | POA: Insufficient documentation

## 2014-12-01 ENCOUNTER — Ambulatory Visit
Admission: RE | Admit: 2014-12-01 | Discharge: 2014-12-01 | Disposition: A | Discharge status: Home / Self Care | Payer: 59 | Source: Ambulatory Visit | Attending: Radiation Oncology | Admitting: Radiation Oncology

## 2014-12-01 DIAGNOSIS — Z510 Encounter for antineoplastic radiation therapy: Secondary | ICD-10-CM | POA: Diagnosis not present

## 2014-12-02 ENCOUNTER — Ambulatory Visit
Admission: RE | Admit: 2014-12-02 | Discharge: 2014-12-02 | Disposition: A | Discharge status: Home / Self Care | Payer: 59 | Source: Ambulatory Visit | Attending: Radiation Oncology | Admitting: Radiation Oncology

## 2014-12-02 DIAGNOSIS — Z510 Encounter for antineoplastic radiation therapy: Secondary | ICD-10-CM | POA: Diagnosis not present

## 2014-12-05 ENCOUNTER — Ambulatory Visit
Admission: RE | Admit: 2014-12-05 | Discharge: 2014-12-05 | Disposition: A | Discharge status: Home / Self Care | Payer: 59 | Source: Ambulatory Visit | Attending: Radiation Oncology | Admitting: Radiation Oncology

## 2014-12-05 DIAGNOSIS — Z510 Encounter for antineoplastic radiation therapy: Secondary | ICD-10-CM | POA: Diagnosis not present

## 2014-12-06 ENCOUNTER — Ambulatory Visit
Admission: RE | Admit: 2014-12-06 | Discharge: 2014-12-06 | Disposition: A | Discharge status: Home / Self Care | Payer: 59 | Source: Ambulatory Visit | Attending: Radiation Oncology | Admitting: Radiation Oncology

## 2014-12-06 ENCOUNTER — Telehealth: Payer: Self-pay | Admitting: *Deleted

## 2014-12-06 DIAGNOSIS — C50512 Malignant neoplasm of lower-outer quadrant of left female breast: Secondary | ICD-10-CM

## 2014-12-06 DIAGNOSIS — Z510 Encounter for antineoplastic radiation therapy: Secondary | ICD-10-CM | POA: Diagnosis not present

## 2014-12-06 NOTE — Progress Notes (Signed)
Weekly Management Note Current Dose: 16.02  Gy  Projected Dose: 52.72 Gy   Narrative:  The patient presents for routine under treatment assessment.  CBCT/MVCT images/Port film x-rays were reviewed.  The chart was checked. Doing well.   Physical Findings: Weight:  . Unchanged skin.   Impression:  The patient is tolerating radiation.  Plan:  Continue treatment as planned. Continue radiaplex.

## 2014-12-06 NOTE — Telephone Encounter (Signed)
Spoke to pt concerning needs during xrt. Relate doing well and without complaints. Discussed survivorship program and referral at the completion of xrt.  Encourage pt to call with needs or questions. Gave contact information on whom to call for massage.

## 2014-12-07 ENCOUNTER — Ambulatory Visit
Admission: RE | Admit: 2014-12-07 | Discharge: 2014-12-07 | Disposition: A | Discharge status: Home / Self Care | Payer: 59 | Source: Ambulatory Visit | Attending: Radiation Oncology | Admitting: Radiation Oncology

## 2014-12-07 DIAGNOSIS — Z510 Encounter for antineoplastic radiation therapy: Secondary | ICD-10-CM | POA: Diagnosis not present

## 2014-12-08 ENCOUNTER — Ambulatory Visit
Admission: RE | Admit: 2014-12-08 | Discharge: 2014-12-08 | Disposition: A | Discharge status: Home / Self Care | Payer: 59 | Source: Ambulatory Visit | Attending: Radiation Oncology | Admitting: Radiation Oncology

## 2014-12-08 DIAGNOSIS — Z510 Encounter for antineoplastic radiation therapy: Secondary | ICD-10-CM | POA: Diagnosis not present

## 2014-12-09 ENCOUNTER — Ambulatory Visit
Admission: RE | Admit: 2014-12-09 | Discharge: 2014-12-09 | Disposition: A | Discharge status: Home / Self Care | Payer: 59 | Source: Ambulatory Visit | Attending: Radiation Oncology | Admitting: Radiation Oncology

## 2014-12-09 DIAGNOSIS — Z510 Encounter for antineoplastic radiation therapy: Secondary | ICD-10-CM | POA: Diagnosis not present

## 2014-12-12 ENCOUNTER — Ambulatory Visit
Admission: RE | Admit: 2014-12-12 | Discharge: 2014-12-12 | Disposition: A | Discharge status: Home / Self Care | Payer: 59 | Source: Ambulatory Visit | Attending: Radiation Oncology | Admitting: Radiation Oncology

## 2014-12-12 DIAGNOSIS — Z510 Encounter for antineoplastic radiation therapy: Secondary | ICD-10-CM | POA: Diagnosis not present

## 2014-12-13 ENCOUNTER — Ambulatory Visit
Admission: RE | Admit: 2014-12-13 | Discharge: 2014-12-13 | Disposition: A | Discharge status: Home / Self Care | Payer: 59 | Source: Ambulatory Visit | Attending: Radiation Oncology | Admitting: Radiation Oncology

## 2014-12-13 ENCOUNTER — Encounter: Payer: Self-pay | Admitting: Radiation Oncology

## 2014-12-13 VITALS — BP 150/83 | HR 63 | Temp 98.4°F | Ht 64.5 in | Wt 103.7 lb

## 2014-12-13 DIAGNOSIS — Z510 Encounter for antineoplastic radiation therapy: Secondary | ICD-10-CM | POA: Diagnosis not present

## 2014-12-13 DIAGNOSIS — C50512 Malignant neoplasm of lower-outer quadrant of left female breast: Secondary | ICD-10-CM

## 2014-12-13 NOTE — Progress Notes (Signed)
Weekly Management Note Current Dose: 39.37  Gy  Projected Dose: 52.72 Gy   Narrative:  The patient presents for routine under treatment assessment.  CBCT/MVCT images/Port film x-rays were reviewed.  The chart was checked. Doing well.   Physical Findings: Weight: 103 lb 11.2 oz (47.038 kg). Unchanged skin.   Impression:  The patient is tolerating radiation.  Plan:  Continue treatment as planned. Continue radiaplex.

## 2014-12-13 NOTE — Progress Notes (Signed)
Catherine Monroe presents for her 11th fraction of radiation to her Left Breast. She reports itching to her Left clavicle area, and she has a red rash in that area and reports using benadryl cream to this area. She reports a "radiating" pain from her incision site to her axilla that if she puts light pressure on it will go away. Her anterior breast is normal and the skin is intact. She denies any fatigue and is running 3 or more miles a day! She denies any other problems at this time.   BP 150/83 mmHg  Pulse 63  Temp(Src) 98.4 F (36.9 C)  Ht 5' 4.5" (1.638 m)  Wt 103 lb 11.2 oz (47.038 kg)  BMI 17.53 kg/m2  LMP 08/31/2011

## 2014-12-14 ENCOUNTER — Ambulatory Visit
Admission: RE | Admit: 2014-12-14 | Discharge: 2014-12-14 | Disposition: A | Discharge status: Home / Self Care | Payer: 59 | Source: Ambulatory Visit | Attending: Radiation Oncology | Admitting: Radiation Oncology

## 2014-12-14 DIAGNOSIS — Z510 Encounter for antineoplastic radiation therapy: Secondary | ICD-10-CM | POA: Diagnosis not present

## 2014-12-15 ENCOUNTER — Ambulatory Visit
Admission: RE | Admit: 2014-12-15 | Discharge: 2014-12-15 | Disposition: A | Discharge status: Home / Self Care | Payer: 59 | Source: Ambulatory Visit | Attending: Radiation Oncology | Admitting: Radiation Oncology

## 2014-12-15 DIAGNOSIS — Z510 Encounter for antineoplastic radiation therapy: Secondary | ICD-10-CM | POA: Diagnosis not present

## 2014-12-16 ENCOUNTER — Ambulatory Visit
Admission: RE | Admit: 2014-12-16 | Discharge: 2014-12-16 | Disposition: A | Discharge status: Home / Self Care | Payer: 59 | Source: Ambulatory Visit | Attending: Radiation Oncology | Admitting: Radiation Oncology

## 2014-12-16 DIAGNOSIS — Z510 Encounter for antineoplastic radiation therapy: Secondary | ICD-10-CM | POA: Diagnosis not present

## 2014-12-18 ENCOUNTER — Ambulatory Visit
Admission: RE | Admit: 2014-12-18 | Discharge: 2014-12-18 | Disposition: A | Discharge status: Home / Self Care | Payer: 59 | Source: Ambulatory Visit | Attending: Radiation Oncology | Admitting: Radiation Oncology

## 2014-12-18 DIAGNOSIS — Z510 Encounter for antineoplastic radiation therapy: Secondary | ICD-10-CM | POA: Diagnosis not present

## 2014-12-19 ENCOUNTER — Ambulatory Visit
Admission: RE | Admit: 2014-12-19 | Discharge: 2014-12-19 | Disposition: A | Discharge status: Home / Self Care | Payer: 59 | Source: Ambulatory Visit | Attending: Radiation Oncology | Admitting: Radiation Oncology

## 2014-12-19 DIAGNOSIS — Z510 Encounter for antineoplastic radiation therapy: Secondary | ICD-10-CM | POA: Diagnosis not present

## 2014-12-20 ENCOUNTER — Encounter: Payer: Self-pay | Admitting: Radiation Oncology

## 2014-12-20 ENCOUNTER — Ambulatory Visit: Payer: 59

## 2014-12-20 ENCOUNTER — Ambulatory Visit
Admission: RE | Admit: 2014-12-20 | Discharge: 2014-12-20 | Disposition: A | Discharge status: Home / Self Care | Payer: 59 | Source: Ambulatory Visit | Attending: Radiation Oncology | Admitting: Radiation Oncology

## 2014-12-20 ENCOUNTER — Ambulatory Visit: Payer: 59 | Admitting: Radiation Oncology

## 2014-12-20 VITALS — BP 138/73 | HR 60 | Temp 97.6°F | Ht 64.5 in | Wt 103.4 lb

## 2014-12-20 DIAGNOSIS — C50512 Malignant neoplasm of lower-outer quadrant of left female breast: Secondary | ICD-10-CM

## 2014-12-20 DIAGNOSIS — Z510 Encounter for antineoplastic radiation therapy: Secondary | ICD-10-CM | POA: Diagnosis not present

## 2014-12-20 NOTE — Progress Notes (Signed)
Weekly Management Note Current Dose:  44.72 Gy  Projected Dose: 52.72 Gy   Narrative:  The patient presents for routine under treatment assessment.  CBCT/MVCT images/Port film x-rays were reviewed.  The chart was checked. No complaints. Doing well. Dermatitis responding to coconut oil.  Physical Findings: Weight: 103 lb 6.4 oz (46.902 kg). Dermatitis medially.   Impression:  The patient is tolerating radiation.  Plan:  Continue treatment as planned.

## 2014-12-20 NOTE — Progress Notes (Signed)
Catherine Monroe has received 17 fractions.  Skin rash to upper chest red, no change in incisional area to left breast from last week.  Using the Radiaplex  Gel.  Stated she used coconut oil today and Benadryl cream last night.  Energy level has been really good. Did not sleep well last night. BP 138/73 mmHg  Pulse 60  Temp(Src) 97.6 F (36.4 C) (Oral)  Ht 5' 4.5" (1.638 m)  Wt 103 lb 6.4 oz (46.902 kg)  BMI 17.48 kg/m2  SpO2 100%  LMP 08/31/2011

## 2014-12-21 ENCOUNTER — Ambulatory Visit: Payer: 59

## 2014-12-21 ENCOUNTER — Ambulatory Visit
Admission: RE | Admit: 2014-12-21 | Discharge: 2014-12-21 | Disposition: A | Discharge status: Home / Self Care | Payer: 59 | Source: Ambulatory Visit | Attending: Radiation Oncology | Admitting: Radiation Oncology

## 2014-12-21 DIAGNOSIS — Z510 Encounter for antineoplastic radiation therapy: Secondary | ICD-10-CM | POA: Diagnosis not present

## 2014-12-23 ENCOUNTER — Ambulatory Visit: Payer: 59

## 2014-12-26 ENCOUNTER — Ambulatory Visit
Admission: RE | Admit: 2014-12-26 | Discharge: 2014-12-26 | Disposition: A | Discharge status: Home / Self Care | Payer: 59 | Source: Ambulatory Visit | Attending: Radiation Oncology | Admitting: Radiation Oncology

## 2014-12-26 DIAGNOSIS — Z510 Encounter for antineoplastic radiation therapy: Secondary | ICD-10-CM | POA: Diagnosis not present

## 2014-12-27 ENCOUNTER — Ambulatory Visit: Payer: 59

## 2014-12-27 ENCOUNTER — Encounter: Payer: Self-pay | Admitting: Radiation Oncology

## 2014-12-27 ENCOUNTER — Ambulatory Visit
Admission: RE | Admit: 2014-12-27 | Discharge: 2014-12-27 | Disposition: A | Discharge status: Home / Self Care | Payer: 59 | Source: Ambulatory Visit | Attending: Radiation Oncology | Admitting: Radiation Oncology

## 2014-12-27 VITALS — BP 124/78 | HR 53 | Temp 98.1°F | Ht 64.5 in | Wt 104.7 lb

## 2014-12-27 DIAGNOSIS — Z510 Encounter for antineoplastic radiation therapy: Secondary | ICD-10-CM | POA: Diagnosis not present

## 2014-12-27 DIAGNOSIS — C50512 Malignant neoplasm of lower-outer quadrant of left female breast: Secondary | ICD-10-CM

## 2014-12-27 NOTE — Progress Notes (Signed)
Ms. Warfield presents for her 20th fraction of radiation to her Left Breast. She denies any fatigue, and still is running 3 miles daily. She does feel tired at the end of the day. Her Left anterior breast is red to the upper medial area, and she reports it is very itchy at times. She is using the radiaplex cream as directed, and occasionally coconut oil to soothe her skin. She has no other complaints at this time.   BP 124/78 mmHg  Pulse 53  Temp(Src) 98.1 F (36.7 C)  Ht 5' 4.5" (1.638 m)  Wt 104 lb 11.2 oz (47.492 kg)  BMI 17.70 kg/m2  LMP 08/31/2011

## 2014-12-27 NOTE — Progress Notes (Signed)
Weekly Management Note Current Dose:  50.72 Gy  Projected Dose: 52.72 Gy   Narrative:  The patient presents for routine under treatment assessment.  CBCT/MVCT images/Port film x-rays were reviewed.  The chart was checked. Dermatitis continues. Using coconut oil. Unsure about tamoxifen but agrees to try.   Physical Findings: Weight: 104 lb 11.2 oz (47.492 kg). Dermatitis medially.   Impression:  The patient is tolerating radiation.  Plan:  Continue treatment as planned. Follow up in 1 month. Discussed survivorship. Discussed post RT skin care. Will refer back to med onc.

## 2014-12-28 ENCOUNTER — Ambulatory Visit
Admission: RE | Admit: 2014-12-28 | Discharge: 2014-12-28 | Disposition: A | Discharge status: Home / Self Care | Payer: 59 | Source: Ambulatory Visit | Attending: Radiation Oncology | Admitting: Radiation Oncology

## 2014-12-28 DIAGNOSIS — Z510 Encounter for antineoplastic radiation therapy: Secondary | ICD-10-CM | POA: Diagnosis not present

## 2014-12-28 DIAGNOSIS — C50512 Malignant neoplasm of lower-outer quadrant of left female breast: Secondary | ICD-10-CM

## 2014-12-29 ENCOUNTER — Ambulatory Visit: Payer: 59

## 2014-12-30 ENCOUNTER — Telehealth: Payer: Self-pay | Admitting: Hematology and Oncology

## 2014-12-30 ENCOUNTER — Telehealth: Payer: Self-pay | Admitting: *Deleted

## 2014-12-30 ENCOUNTER — Other Ambulatory Visit: Payer: Self-pay | Admitting: Adult Health

## 2014-12-30 DIAGNOSIS — C50512 Malignant neoplasm of lower-outer quadrant of left female breast: Secondary | ICD-10-CM

## 2014-12-30 NOTE — Telephone Encounter (Signed)
Called pt to congratulate on completion of xrt. Relate doing well and without complaints. Confirmed future appts. POF placed for f/u with Dr. Lindi Adie. Denies further needs or questions. Encourage pt to call with concerns. Received verbal understanding.

## 2014-12-30 NOTE — Telephone Encounter (Signed)
sw pt and advised on DEC appt...pt ok and aware °

## 2014-12-30 NOTE — Telephone Encounter (Signed)
Called patient with survivorship

## 2015-01-11 NOTE — Progress Notes (Signed)
  Radiation Oncology         [phone] ________________________________  Name: Catherine Monroe MRN: [id number]  Date: 12/28/2014  DOB: [birth year]  End of Treatment Note  Diagnosis:  Stage I left breast cancer  Indication for treatment:  Curative     Radiation treatment dates:   11/29/14-12/28/14  Site/dose:   Left breast/ 42.72 Gy at 2.67 Gy per fraction x 21 fractions.  Left breast boost/ 10 Gy at 2 Gy per fraction x 5 fractions  Beams/energy:  Opposed tangents with reduced fields / 6 MV photons Photons boost with 6 and 10 MV photons  Narrative: The patient tolerated radiation treatment relatively well.   She had minimal skin changes and good energy. She developed dermatitis medially which she treated with coconut oil.  Plan: The patient has completed radiation treatment. The patient will return to radiation oncology clinic for routine followup in one month. I advised them to call or return sooner if they have any questions or concerns related to their recovery or treatment.  ------------------------------------------------  Thea Silversmith, MD

## 2015-01-11 NOTE — Progress Notes (Signed)
Name: Catherine Monroe   MRN: [id number]  Date:  12/09/14   DOB: 1960/09/22  Status:outpatient    DIAGNOSIS: Stage I left breast cancer  CONSENT VERIFIED: yes   SET UP: Patient is setup supine   IMMOBILIZATION:  The following immobilization was used:Custom Moldable Pillow, breast board.   NARRATIVE: [name] underwent complex simulation and treatment planning for her boost treatment today.  Her tumor volume was outlined on the planning CT scan.  Due to the depth of her cavity, electrons could not be used and a photon plan was developed. The plan will be prescribed to the 100% isodose line.   I personally supervised and approved the creation of 3 unique MLCs comprising 3   treatment devices.

## 2015-01-16 ENCOUNTER — Encounter: Payer: Self-pay | Admitting: Hematology and Oncology

## 2015-01-16 ENCOUNTER — Telehealth: Payer: Self-pay | Admitting: Hematology and Oncology

## 2015-01-16 ENCOUNTER — Ambulatory Visit (HOSPITAL_BASED_OUTPATIENT_CLINIC_OR_DEPARTMENT_OTHER): Payer: 59 | Admitting: Hematology and Oncology

## 2015-01-16 VITALS — BP 170/93 | HR 64 | Temp 97.5°F | Resp 18 | Ht 64.5 in | Wt 105.2 lb

## 2015-01-16 DIAGNOSIS — Z79811 Long term (current) use of aromatase inhibitors: Secondary | ICD-10-CM | POA: Diagnosis not present

## 2015-01-16 DIAGNOSIS — D0512 Intraductal carcinoma in situ of left breast: Secondary | ICD-10-CM

## 2015-01-16 DIAGNOSIS — Z170 Estrogen receptor positive status [ER+]: Secondary | ICD-10-CM

## 2015-01-16 DIAGNOSIS — C50512 Malignant neoplasm of lower-outer quadrant of left female breast: Secondary | ICD-10-CM

## 2015-01-16 MED ORDER — ANASTROZOLE 1 MG PO TABS: 1.0000 mg | ORAL_TABLET | Freq: Every day | ORAL | Status: DC

## 2015-01-16 NOTE — Telephone Encounter (Signed)
Appointments made and avs

## 2015-01-16 NOTE — Assessment & Plan Note (Signed)
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

## 2015-01-16 NOTE — Progress Notes (Signed)
Patient Care Team: Hali Marry, MD as PCP - General Thea Silversmith, MD as Consulting Physician (Radiation Oncology) Nicholas Lose, MD as Consulting Physician (Hematology and Oncology)  DIAGNOSIS: No matching staging information was found for the patient.  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 ER 100%, PR 30%, HER-2/neu negative, Ki-67 3%, grade 1   08/10/2014 Imaging Ultrasound left breast: Irregular hypoechoic mass left breast 1.3 x 1.3 x 0.6 cm with additional echogenic foci corresponding to the spiculated mass and microcalcifications   09/23/2014 Surgery Left Lumpectomy: 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 She done extremely well from radiation standpoint. Denies any pain or discomfort. The skin has healed very well.  REVIEW OF SYSTEMS:   Constitutional: Denies fevers, chills or abnormal weight loss Eyes: Denies blurriness of vision Ears, nose, mouth, throat, and face: Denies mucositis or sore throat Respiratory: Denies cough, dyspnea or wheezes Cardiovascular: Denies palpitation, chest discomfort or lower extremity swelling Gastrointestinal:  Denies nausea, heartburn or change in bowel  habits Skin: Denies abnormal skin rashes Lymphatics: Denies new lymphadenopathy or easy bruising Neurological:Denies numbness, tingling or new weaknesses Behavioral/Psych: Mood is stable, no new changes  Breast:  denies any pain or lumps or nodules in either breasts All other systems were reviewed with the patient and are negative.  I have reviewed the past medical history, past surgical history, social history and family history with the patient and they are unchanged from previous note.  ALLERGIES:  is allergic to penicillins.  MEDICATIONS:  Current Outpatient Prescriptions  Medication Sig Dispense Refill  . hyaluronate sodium (RADIAPLEXRX) GEL Apply 1 application topically 2 (two) times daily.    . Magnesium 100 MG CAPS Take 4 capsules by mouth daily.     . Melatonin 2.5 MG CAPS Take 1 capsule by mouth at bedtime.     . NEOMYCIN-POLYMYXIN-HYDROCORTISONE (CORTISPORIN) 1 % SOLN otic solution 3 DROPS IN BOTH EARS 3 TIMES/DAY X 1 WK  2  . non-metallic deodorant (ALRA) MISC Apply 1 application topically daily as needed.     No current facility-administered medications for this visit.    PHYSICAL EXAMINATION: ECOG PERFORMANCE STATUS: 0 - Asymptomatic  Filed Vitals:   01/16/15 1421  BP: 170/93  Pulse: 64  Temp: 97.5 F (36.4 C)  Resp: 18   Filed Weights   01/16/15 1421  Weight: 105 lb 3.2 oz (47.718 kg)    GENERAL:alert, no distress and comfortable SKIN: skin color, texture, turgor are normal, no rashes or significant lesions EYES: normal, Conjunctiva are pink and non-injected, sclera clear OROPHARYNX:no exudate, no erythema and lips, buccal mucosa, and tongue normal  NECK: supple, thyroid normal size, non-tender, without nodularity LYMPH:  no palpable lymphadenopathy in the cervical, axillary or inguinal LUNGS: clear to auscultation and percussion with normal breathing effort HEART: regular rate &  rhythm and no murmurs and no lower extremity edema ABDOMEN:abdomen soft,  non-tender and normal bowel sounds Musculoskeletal:no cyanosis of digits and no clubbing  NEURO: alert & oriented x 3 with fluent speech, no focal motor/sensory deficits  LABORATORY DATA:  I have reviewed the data as listed   Chemistry      Component Value Date/Time   NA 144 09/08/2014 1125   K 3.9 09/08/2014 1125   CL 107 09/08/2014 1125   CO2 27 09/08/2014 1125   BUN <5* 09/08/2014 1125   CREATININE 0.75 09/08/2014 1125   CREATININE 0.59 02/23/2013 1535      Component Value Date/Time   CALCIUM 9.4 09/08/2014 1125   ALKPHOS 67 02/23/2013 1535   AST 21 02/23/2013 1535   ALT 30 02/23/2013 1535   BILITOT 0.8 02/23/2013 1535       Lab Results  Component Value Date   WBC 4.9 09/08/2014   HGB 14.1 09/08/2014   HCT 43.0 09/08/2014   MCV 92.3 09/08/2014   PLT 181 09/08/2014   ASSESSMENT & PLAN:  Breast cancer of lower-outer quadrant of left female breast Left Lumpectomy 09/23/2014: 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  Anastrozole counseling:We discussed the risks and benefits of anti-estrogen therapy with aromatase inhibitors. These include but not limited to insomnia, hot flashes, mood changes, vaginal dryness, bone density loss, and weight gain. We strongly believe that the benefits far outweigh the risks. Patient understands these risks and consented to starting treatment. Planned treatment duration is 5 years.  Return to clinic in 3 months for follow-up  No orders of the defined types were placed in this encounter.   The patient has a good understanding of the overall plan. she agrees with it. she will call with any problems that may develop  before the next visit here.   GRulon Eisenmenger MD 01/16/2015

## 2015-01-26 NOTE — Progress Notes (Signed)
   Department of Radiation Oncology  Phone:  [phone] Fax:        [phone]   Name: Catherine Monroe MRN: [id number]  DOB: May 14, 1960  Date: 01/27/2015  Follow Up Visit Note  Diagnosis: Stage I left breast cancer  Summary and Interval since last radiation: 11/29/14-12/28/14  Site/dose:   Left breast/ 42.72 Gy at 2.67 Gy per fraction x 21 fractions.  Left breast boost/ 10 Gy at 2 Gy per fraction x 5 fractions  Interval History: Catherine Monroe presents today for routine followup.  Her skin has healed up well. She has not been using anything on it. She is pleased with her cosmetic outcome. She has agreed to try anastrazole for a couple months and will start in January.  She has survivorship in February.   Physical Exam:  Filed Vitals:   01/27/15 1616  BP: 166/93  Pulse: 61  Temp: 98.4 F (36.9 C)   Pleasant female. Volume loss in lower outer quadrant. Dry skin. Minimal hyper pigmentation  IMPRESSION: Catherine Monroe is a 54 y.o. female s.p radiation with resolving acute effects of treatment.   PLAN: She has an appointment scheduled with survivorship and a bone scan on 02/20/15 and will follow up with Dr. Lindi Adie on 04/17/15. We discussed survivorship and the importance of a healthy lifestyle. She is doing well. We discussed the need for follow up every 4-6 months which she has scheduled.  We discussed the need for yearly mammograms which she can schedule with her OBGYN or with medical oncology. We discussed the need for sun protection in the treated area.  She can always call me with questions.  I will follow up with her on an as needed basis.     Thea Silversmith, MD  This document serves as a record of services personally performed by Thea Silversmith, MD. It was created on her behalf by Darcus Austin, a trained medical scribe. The creation of this record is based on the scribe's personal observations and the provider's statements to them. This document has been checked and approved by the attending  provider.

## 2015-01-27 ENCOUNTER — Encounter: Payer: Self-pay | Admitting: Radiation Oncology

## 2015-01-27 ENCOUNTER — Ambulatory Visit
Admission: RE | Admit: 2015-01-27 | Discharge: 2015-01-27 | Disposition: A | Discharge status: Home / Self Care | Payer: 59 | Source: Ambulatory Visit | Attending: Radiation Oncology | Admitting: Radiation Oncology

## 2015-01-27 VITALS — BP 166/93 | HR 61 | Temp 98.4°F

## 2015-01-27 DIAGNOSIS — C50512 Malignant neoplasm of lower-outer quadrant of left female breast: Secondary | ICD-10-CM

## 2015-02-20 ENCOUNTER — Ambulatory Visit
Admission: RE | Admit: 2015-02-20 | Discharge: 2015-02-20 | Disposition: A | Discharge status: Home / Self Care | Payer: 59 | Source: Ambulatory Visit | Attending: Hematology and Oncology | Admitting: Hematology and Oncology

## 2015-02-20 DIAGNOSIS — M8589 Other specified disorders of bone density and structure, multiple sites: Secondary | ICD-10-CM | POA: Diagnosis not present

## 2015-02-20 DIAGNOSIS — C50512 Malignant neoplasm of lower-outer quadrant of left female breast: Secondary | ICD-10-CM

## 2015-02-28 DIAGNOSIS — H527 Unspecified disorder of refraction: Secondary | ICD-10-CM | POA: Diagnosis not present

## 2015-02-28 DIAGNOSIS — H524 Presbyopia: Secondary | ICD-10-CM | POA: Diagnosis not present

## 2015-03-02 ENCOUNTER — Encounter: Payer: 59 | Admitting: Nurse Practitioner

## 2015-03-15 ENCOUNTER — Encounter: Payer: Self-pay | Admitting: Nurse Practitioner

## 2015-03-15 DIAGNOSIS — C50512 Malignant neoplasm of lower-outer quadrant of left female breast: Secondary | ICD-10-CM

## 2015-03-15 NOTE — Progress Notes (Signed)
The Survivorship Care Plan was mailed to Catherine Monroe as she reported not being able to come in to the Survivorship Clinic for an in-person visit at this time. A letter was mailed to her outlining the purpose of the content of the care plan, as well as encouraging her to reach out to me with any questions or concerns.  My business card was included in the correspondence to the patient as well.  A copy of the care plan was also routed/faxed/mailed to [place], [place], the patient's PCP.  I will not be placing any follow-up appointments to the Survivorship Clinic for Catherine Monroe, but I am happy to see her at any time in the future for any survivorship concerns that may arise. Thank you for allowing me to participate in her care!  Kenn File, [place] [phone]

## 2015-03-27 MED FILL — ANASTROZOLE 1 MG TABLET: 1 | 30 days supply | Qty: 30 | Fill #0

## 2015-04-16 NOTE — Assessment & Plan Note (Signed)
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  Anastrozole Toxicities:  Return to clinic in 6 months for follow-up

## 2015-04-17 ENCOUNTER — Telehealth: Payer: Self-pay | Admitting: Hematology and Oncology

## 2015-04-17 ENCOUNTER — Ambulatory Visit (HOSPITAL_BASED_OUTPATIENT_CLINIC_OR_DEPARTMENT_OTHER): Payer: 59 | Admitting: Hematology and Oncology

## 2015-04-17 ENCOUNTER — Encounter: Payer: Self-pay | Admitting: Hematology and Oncology

## 2015-04-17 VITALS — BP 159/73 | HR 59 | Temp 98.0°F | Resp 18 | Wt 109.7 lb

## 2015-04-17 DIAGNOSIS — C50512 Malignant neoplasm of lower-outer quadrant of left female breast: Secondary | ICD-10-CM

## 2015-04-17 DIAGNOSIS — M8580 Other specified disorders of bone density and structure, unspecified site: Secondary | ICD-10-CM

## 2015-04-17 DIAGNOSIS — Z79811 Long term (current) use of aromatase inhibitors: Secondary | ICD-10-CM | POA: Diagnosis not present

## 2015-04-17 DIAGNOSIS — Z170 Estrogen receptor positive status [ER+]: Secondary | ICD-10-CM | POA: Diagnosis not present

## 2015-04-17 NOTE — Progress Notes (Signed)
Patient Care Team: Hali Marry, MD as PCP - General Thea Silversmith, MD as Consulting Physician (Radiation Oncology) Nicholas Lose, MD as Consulting Physician (Hematology and Oncology) Alphonsa Overall, MD as Consulting Physician (General Surgery) Sylvan Cheese, NP as Nurse Practitioner (Hematology and Oncology)  DIAGNOSIS: Breast cancer of lower-outer quadrant of left female breast [facility])   Staging form: Breast, AJCC 7th Edition     Clinical stage from 08/19/2014: Stage IA (T1c, N0, M0) - Unsigned     Pathologic stage from 09/23/2014: Stage IB (T1c, N1m, cM0) - Unsigned   SUMMARY OF ONCOLOGIC HISTORY:   [place]   07/21/2014 Mammogram Left breast mass based on mammogram done at [facility] breast density C; diagnostic mammogram 08/10/2014 pleomorphic calcifications posterolateral to the mass and 3-4 cm   08/10/2014 Breast [place] left breast 1.3 x 1.3 x 0.6 cm with additional echogenic foci corresponding to the spiculated mass and microcalcifications   08/10/2014 Initial Biopsy Left breast biopsy 4:00 position: 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 Left breast boost/ 10 Gy at 2 Gy per fraction x 5 fractions   01/16/2015 -  Anti-estrogen oral therapy Anastrozole 1 mg daily. Planned duration of therapy 5 years.   03/15/2015 Survivorship Survivorship care plan mailed to patient in  lieu of in person visit.    CHIEF COMPLIANT: follow-up on anastrozole  INTERVAL HISTORY: Catherine Monroe a 54 31ar old with above-mentioned history of left breast cancer and underwent lumpectomy followed by adjuvant radiation and is currently on anastrozole therapy. She does have occasional hot flashes and muscular aches and pains which get better with physical activity and motion. She is a very active individual who does a lot of exercises and keeps quite busy. She had a previous fracture of the right foot which took a long time to heal.  REVIEW OF SYSTEMS:   Constitutional: Denies fevers, chills or abnormal weight loss Eyes: Denies blurriness of vision Ears, nose, mouth, throat, and face: Denies mucositis or sore throat Respiratory: Denies cough, dyspnea or wheezes Cardiovascular: Denies palpitation, chest discomfort Gastrointestinal:  Denies nausea, heartburn or change in bowel habits Skin: Denies abnormal skin rashes Lymphatics: Denies new lymphadenopathy or easy bruising Neurological:Denies numbness, tingling or new weaknesses Behavioral/Psych: Mood is stable, no new changes  Extremities: No lower extremity edema Breast:  denies any pain or lumps or nodules in either breasts All other systems were reviewed with the patient and are negative.  I have reviewed the past medical history, past surgical history, social history and family history with the patient and they are unchanged from previous note.  ALLERGIES:  is allergic to penicillins.  MEDICATIONS:  Current Outpatient Prescriptions  Medication Sig Dispense Refill  . anastrozole (ARIMIDEX) 1 MG tablet Take 1 tablet (1 mg total) by mouth daily. (Patient not taking: Reported on 01/27/2015) 90 tablet 3  . hyaluronate sodium (RADIAPLEXRX) GEL Apply 1 application topically 2 (two) times daily.  Reported on 01/27/2015    . Magnesium 100 MG CAPS Take 4 capsules by mouth daily.     . Melatonin 2.5 MG CAPS Take 1 capsule by mouth at  bedtime.     . NEOMYCIN-POLYMYXIN-HYDROCORTISONE (CORTISPORIN) 1 % SOLN otic solution 3 DROPS IN BOTH EARS 3 TIMES/DAY X 1 WK  2  . non-metallic deodorant (ALRA) MISC Apply 1 application topically daily as needed. Reported on 01/27/2015     No current facility-administered medications for this visit.    PHYSICAL EXAMINATION: ECOG PERFORMANCE STATUS: 1 - Symptomatic but completely ambulatory  Filed Vitals:   04/17/15 1517  BP: 159/73  Pulse: 59  Temp: 98 F (36.7 C)  Resp: 18   Filed Weights   04/17/15 1517  Weight: 109 lb 11.2 oz (49.76 kg)    GENERAL:alert, no distress and comfortable SKIN: skin color, texture, turgor are normal, no rashes or significant lesions EYES: normal, Conjunctiva are pink and non-injected, sclera clear OROPHARYNX:no exudate, no erythema and lips, buccal mucosa, and tongue normal  NECK: supple, thyroid normal size, non-tender, without nodularity LYMPH:  no palpable lymphadenopathy in the cervical, axillary or inguinal LUNGS: clear to auscultation and percussion with normal breathing effort HEART: regular rate & rhythm and no murmurs and no lower extremity edema ABDOMEN:abdomen soft, non-tender and normal bowel sounds MUSCULOSKELETAL:no cyanosis of digits and no clubbing  NEURO: alert & oriented x 3 with fluent speech, no focal motor/sensory deficits EXTREMITIES: No lower extremity edema BREAST: No palpable masses or nodules in either right or left breasts. No palpable axillary supraclavicular or infraclavicular adenopathy no breast tenderness or nipple discharge. (exam performed in the presence of a chaperone)  LABORATORY DATA:  I have reviewed the data as listed   Chemistry      Component Value Date/Time   NA 144 09/08/2014 1125   K 3.9 09/08/2014 1125   CL 107 09/08/2014 1125   CO2 27 09/08/2014 1125   BUN <5* 09/08/2014 1125   CREATININE 0.75 09/08/2014 1125   CREATININE 0.59 02/23/2013 1535      Component Value Date/Time   CALCIUM 9.4  09/08/2014 1125   ALKPHOS 67 02/23/2013 1535   AST 21 02/23/2013 1535   ALT 30 02/23/2013 1535   BILITOT 0.8 02/23/2013 1535      Lab Results  Component Value Date   WBC 4.9 09/08/2014   HGB 14.1 09/08/2014   HCT 43.0 09/08/2014   MCV 92.3 09/08/2014   PLT 181 09/08/2014   ASSESSMENT & PLAN:  Breast cancer of lower-outer quadrant of left female breast Left Lumpectomy 09/23/2014: 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  Anastrozole Toxicities: 1. Intermittent hot flashes 2. Muscle stiffness in the morning but that gets better with activity  Bone density: Severe osteopenia with a T score of -2.4: We had lengthy discussion about the bone density test and the 6 locations. I worry that if she continued anastrozole with such severe osteopenia, she would be at a high risk of fractures. I provided her with 2 options. 1. Switch to tamoxifen +/- bisphosphonate therapy 2. Continue with anastrozole + bisphosphonate therapy I would probably prefer the former. But the patient will think about her options  and will call us to let us know. I discussed with her the different bisphosphonate therapy options include Fosamax, Boniva, Prolia injection. I discussed the risk of osteonecrosis of the jaw if she has tooth extraction on bisphosphonate therapy.  Return to clinic in 6 months for follow-up  No orders of the defined types were placed in this encounter.   The patient has a good understanding of the overall plan. she agrees with it. she will call with any problems that may develop before the next visit here.   Rulon Eisenmenger, MD 04/17/2015

## 2015-04-17 NOTE — Telephone Encounter (Signed)
appt made and avs printed °

## 2015-04-18 ENCOUNTER — Encounter: Payer: Self-pay | Admitting: Family Medicine

## 2015-04-20 ENCOUNTER — Other Ambulatory Visit: Payer: Self-pay

## 2015-04-20 DIAGNOSIS — M8580 Other specified disorders of bone density and structure, unspecified site: Secondary | ICD-10-CM

## 2015-04-20 DIAGNOSIS — C50512 Malignant neoplasm of lower-outer quadrant of left female breast: Secondary | ICD-10-CM

## 2015-04-20 MED ORDER — TAMOXIFEN CITRATE 20 MG PO TABS: 20.0000 mg | ORAL_TABLET | Freq: Every day | ORAL | Status: DC

## 2015-04-20 MED ORDER — IBANDRONATE SODIUM 150 MG PO TABS: 150.0000 mg | ORAL_TABLET | ORAL | Status: DC

## 2015-04-20 MED FILL — IBANDRONATE NA 150 MG TAB: 150 | 84 days supply | Qty: 3 | Fill #0

## 2015-04-20 MED FILL — TAMOXIFEN 20 MG TABLET: 20 | 90 days supply | Qty: 90 | Fill #0

## 2015-04-25 ENCOUNTER — Encounter: Payer: Self-pay | Admitting: Hematology and Oncology

## 2015-05-11 DIAGNOSIS — H9501 Recurrent cholesteatoma of postmastoidectomy cavity, right ear: Secondary | ICD-10-CM | POA: Diagnosis not present

## 2015-05-11 DIAGNOSIS — H95111 Chronic inflammation of postmastoidectomy cavity, right ear: Secondary | ICD-10-CM | POA: Diagnosis not present

## 2015-05-11 DIAGNOSIS — H9011 Conductive hearing loss, unilateral, right ear, with unrestricted hearing on the contralateral side: Secondary | ICD-10-CM | POA: Diagnosis not present

## 2015-05-29 ENCOUNTER — Other Ambulatory Visit: Payer: Self-pay | Admitting: *Deleted

## 2015-05-29 DIAGNOSIS — C50512 Malignant neoplasm of lower-outer quadrant of left female breast: Secondary | ICD-10-CM

## 2015-05-29 MED ORDER — ANASTROZOLE 1 MG PO TABS: 1.0000 mg | ORAL_TABLET | Freq: Every day | ORAL | Status: DC

## 2015-08-07 ENCOUNTER — Other Ambulatory Visit: Payer: Self-pay | Admitting: Family Medicine

## 2015-08-07 DIAGNOSIS — Z853 Personal history of malignant neoplasm of breast: Secondary | ICD-10-CM

## 2015-08-07 DIAGNOSIS — Z9889 Other specified postprocedural states: Secondary | ICD-10-CM

## 2015-08-16 ENCOUNTER — Ambulatory Visit
Admission: RE | Admit: 2015-08-16 | Discharge: 2015-08-16 | Disposition: A | Discharge status: Home / Self Care | Payer: 59 | Source: Ambulatory Visit | Attending: Family Medicine | Admitting: Family Medicine

## 2015-08-16 DIAGNOSIS — Z9889 Other specified postprocedural states: Secondary | ICD-10-CM

## 2015-08-16 DIAGNOSIS — R928 Other abnormal and inconclusive findings on diagnostic imaging of breast: Secondary | ICD-10-CM | POA: Diagnosis not present

## 2015-08-16 DIAGNOSIS — Z853 Personal history of malignant neoplasm of breast: Secondary | ICD-10-CM

## 2015-10-06 ENCOUNTER — Encounter: Payer: Self-pay | Admitting: Family Medicine

## 2015-10-06 ENCOUNTER — Ambulatory Visit (INDEPENDENT_AMBULATORY_CARE_PROVIDER_SITE_OTHER): Payer: 59 | Admitting: Family Medicine

## 2015-10-06 VITALS — BP 131/69 | HR 58 | Ht 61.25 in | Wt 114.0 lb

## 2015-10-06 DIAGNOSIS — Z01419 Encounter for gynecological examination (general) (routine) without abnormal findings: Secondary | ICD-10-CM

## 2015-10-06 DIAGNOSIS — B002 Herpesviral gingivostomatitis and pharyngotonsillitis: Secondary | ICD-10-CM

## 2015-10-06 DIAGNOSIS — Z23 Encounter for immunization: Secondary | ICD-10-CM

## 2015-10-06 DIAGNOSIS — Z0000 Encounter for general adult medical examination without abnormal findings: Secondary | ICD-10-CM

## 2015-10-06 DIAGNOSIS — Z0189 Encounter for other specified special examinations: Secondary | ICD-10-CM | POA: Diagnosis not present

## 2015-10-06 DIAGNOSIS — R748 Abnormal levels of other serum enzymes: Secondary | ICD-10-CM

## 2015-10-06 MED ORDER — ACYCLOVIR 5 % EX OINT: 1.0000 "application " | TOPICAL_OINTMENT | CUTANEOUS | 99 refills | Status: DC | PRN

## 2015-10-06 NOTE — Addendum Note (Signed)
Addended by: Teddy Spike on: 10/06/2015 04:16 PM   Modules accepted: Orders

## 2015-10-06 NOTE — Progress Notes (Signed)
Subjective:     Catherine Monroe is a 55 y.o. female and is here for a comprehensive physical exam. The patient reports no problems.She is really doing well since her diagnosis of breast cancer last year. She did undergo radiation therapy. She opted not to take tamoxifen.   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 She does not currently have any treatment that she uses.  She's been exercising regularly by running 3 miles a day and doing hot yoga.  Social History   Social History  . Marital status: Married    Spouse name: N/A  . Number of children: 2  . Years of education: N/A   Occupational History  . Nurse    Social History Main Topics  . Smoking status: Former Smoker    Packs/day: 1.00    Types: Cigarettes    Quit date: 01/29/1987  . Smokeless tobacco: Never Used  . Alcohol use No  . Drug use: No  . Sexual activity: Yes    Partners: Male   Other Topics Concern  . Not on file   Social History Narrative   Regular exercise. Runs 3 miles per day and does hot yoga.     Health Maintenance  Topic Date Due  . INFLUENZA VACCINE  10/06/2015 (Originally 08/29/2015)  . COLONOSCOPY  10/06/2015 (Originally 07/28/2012)  . MAMMOGRAM  08/15/2017  . PAP SMEAR  10/05/2017  . TETANUS/TDAP  03/19/2021  . Hepatitis C Screening  Completed  . HIV Screening  Completed    The following portions of the patient's history were reviewed and updated as appropriate: allergies, current medications, past family history, past medical history, past social history, past surgical history and problem list.  Review of Systems Pertinent items noted in HPI and remainder of comprehensive ROS otherwise negative.   Objective:    BP 131/69 (BP Location: Right Arm)   Pulse (!) 58   Ht 5' 4.5" (1.638 m)   Wt 114 lb (51.7 kg)   LMP 08/31/2011   BMI 19.27 kg/m  General appearance: alert, cooperative and appears stated age Head: Normocephalic, without obvious abnormality,  atraumatic Eyes: conj clear, EOMi, PEERLA Ears: Left TM and canal is clear, right canal with deformity and a little bit of moisture at the base. Nose: Nares normal. Septum midline. Mucosa normal. No drainage or sinus tenderness. Throat: lips, mucosa, and tongue normal; teeth and gums normal Neck: no adenopathy, no carotid bruit, no JVD, supple, symmetrical, trachea midline and thyroid not enlarged, symmetric, no tenderness/mass/nodules Back: symmetric, no curvature. ROM normal. No CVA tenderness. Lungs: clear to auscultation bilaterally Heart: regular rate and rhythm, S1, S2 normal, no murmur, click, rub or gallop Abdomen: soft, non-tender; bowel sounds normal; no masses,  no organomegaly Extremities: extremities normal, atraumatic, no cyanosis or edema Pulses: 2+ and symmetric Skin: Skin color, texture, turgor normal. No rashes or lesions Lymph nodes: Cervical, supraclavicular, and axillary nodes normal. Neurologic: Alert and oriented X 3, normal strength and tone. Normal symmetric reflexes. Normal coordination and gait    Assessment:    Healthy female exam.      Plan:     See After Visit Summary for Counseling Recommendations    Keep up a regular exercise program and make sure you are eating a healthy diet Try to eat 4 servings of dairy a day, or if you are lactose intolerant take a calcium with vitamin D daily.  Your vaccines are up to date.   Oral herpes-will  send in a perception for topical acyclovir to use as needed.

## 2015-10-07 LAB — COMPLETE METABOLIC PANEL WITH GFR
ALT: 48 U/L — AB (ref 6–29)
AST: 36 U/L — ABNORMAL HIGH (ref 10–35)
Albumin: 4.6 g/dL (ref 3.6–5.1)
Alkaline Phosphatase: 97 U/L (ref 33–130)
BILIRUBIN TOTAL: 0.6 mg/dL (ref 0.2–1.2)
BUN: 15 mg/dL (ref 7–25)
CALCIUM: 9.1 mg/dL (ref 8.6–10.4)
CHLORIDE: 102 mmol/L (ref 98–110)
CO2: 25 mmol/L (ref 20–31)
CREATININE: 0.62 mg/dL (ref 0.50–1.05)
Glucose, Bld: 97 mg/dL (ref 65–99)
Potassium: 4.2 mmol/L (ref 3.5–5.3)
Sodium: 139 mmol/L (ref 135–146)
Total Protein: 6.9 g/dL (ref 6.1–8.1)

## 2015-10-09 ENCOUNTER — Encounter: Payer: Self-pay | Admitting: Family Medicine

## 2015-10-09 NOTE — Addendum Note (Signed)
Addended by: Teddy Spike on: 10/09/2015 12:47 PM   Modules accepted: Orders

## 2015-10-17 ENCOUNTER — Ambulatory Visit (HOSPITAL_BASED_OUTPATIENT_CLINIC_OR_DEPARTMENT_OTHER): Payer: 59 | Admitting: Hematology and Oncology

## 2015-10-17 ENCOUNTER — Encounter: Payer: Self-pay | Admitting: Hematology and Oncology

## 2015-10-17 DIAGNOSIS — M8580 Other specified disorders of bone density and structure, unspecified site: Secondary | ICD-10-CM | POA: Diagnosis not present

## 2015-10-17 DIAGNOSIS — Z79811 Long term (current) use of aromatase inhibitors: Secondary | ICD-10-CM

## 2015-10-17 DIAGNOSIS — C50512 Malignant neoplasm of lower-outer quadrant of left female breast: Secondary | ICD-10-CM

## 2015-10-17 DIAGNOSIS — Z170 Estrogen receptor positive status [ER+]: Secondary | ICD-10-CM | POA: Diagnosis not present

## 2015-10-17 NOTE — Progress Notes (Signed)
Patient Care Team: Hali Marry, MD as PCP - General Thea Silversmith, MD as Consulting Physician (Radiation Oncology) Nicholas Lose, MD as Consulting Physician (Hematology and Oncology) Alphonsa Overall, MD as Consulting Physician (General Surgery) Sylvan Cheese, NP as Nurse Practitioner (Hematology and Oncology)  DIAGNOSIS: Breast cancer of lower-outer quadrant of left female breast [facility])   Staging form: Breast, AJCC 7th Edition   - Clinical stage from 08/19/2014: Stage IA (T1c, N0, M0) - Unsigned   - Pathologic stage from 09/23/2014: Stage IB (T1c, N42m, cM0) - Unsigned  SUMMARY OF ONCOLOGIC HISTORY:   [place]   07/21/2014 Mammogram    Left breast mass based on mammogram done [facility] breast density C; diagnostic mammogram 08/10/2014 pleomorphic calcifications posterolateral to the mass and 3-4 cm      08/10/2014 Breast [place]   Irregular hypoechoic mass left breast 1.3 x 1.3 x 0.6 cm with additional echogenic foci corresponding to the spiculated mass and microcalcifications      08/10/2014 Initial Biopsy    Left breast biopsy 4:00 position: 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 Left breast boost/ 10 Gy at 2 Gy per fraction x 5 fractions      01/29/2015 - 05/17/2015 Anti-estrogen oral therapy    Anastrozole 1 mg daily.       03/15/2015  Survivorship    Survivorship care plan mailed to patient in lieu of in person visit.       CHIEF COMPLIANT: Patient stop antiestrogen therapy in April 2017  INTERVAL HISTORY: KarNUMA HEATWOLE a 55 56ar old with above-mentioned history of left breast cancer treated with lumpectomy adjuvant radiation therapy and to anastrozole for 3 months. She decided not to continue any further because she does have osteopenia and that she will have to take bisphosphonates. She tried Boniva for a month and apparently her to broke off and since then she stopped Boniva and also stopped anastrozole therapy. She prefers to focus on holistic medications than prescription medications.  REVIEW OF SYSTEMS:   Constitutional: Denies fevers, chills or abnormal weight loss Eyes: Denies blurriness of vision Ears, nose, mouth, throat, and face: Denies mucositis or sore throat Respiratory: Denies cough, dyspnea or wheezes Cardiovascular: Denies palpitation, chest discomfort Gastrointestinal:  Denies nausea, heartburn or change in bowel habits Skin: Denies abnormal skin rashes Lymphatics: Denies new lymphadenopathy or easy bruising Neurological:Denies numbness, tingling or new weaknesses Behavioral/Psych: Mood is stable, no new changes  Extremities: No lower extremity edema Breast:  denies any pain or lumps or nodules in either breasts All other systems were reviewed with the patient and are negative.  I have reviewed the past medical history, past surgical history, social history and family history with the patient and they are unchanged from previous note.  ALLERGIES:  is allergic to penicillins.  MEDICATIONS:  Current Outpatient Prescriptions  Medication Sig Dispense Refill  . Magnesium 100 MG CAPS Take 4 capsules by mouth daily.     . Melatonin 2.5 MG CAPS Take 1 capsule by mouth at bedtime.     . NEOMYCIN-POLYMYXIN-HYDROCORTISONE (CORTISPORIN) 1 % SOLN otic solution 3 DROPS IN BOTH EARS 3 TIMES/DAY X 1 WK  2     No current facility-administered medications for this visit.     PHYSICAL EXAMINATION: ECOG PERFORMANCE STATUS: 0 - Asymptomatic  Vitals:   10/17/15 1520  BP: 131/77  Pulse: 63  Resp: 18  Temp: 98.4 F (36.9 C)   Filed Weights   10/17/15 1520  Weight: 114 lb 1.6 oz (51.8 kg)    GENERAL:alert, no distress and comfortable SKIN: skin color, texture, turgor are normal, no rashes or significant lesions EYES: normal, Conjunctiva are pink and non-injected, sclera clear OROPHARYNX:no exudate, no erythema and lips, buccal mucosa, and tongue normal  NECK: supple, thyroid normal size, non-tender, without nodularity LYMPH:  no palpable lymphadenopathy in the cervical, axillary or inguinal LUNGS: clear to auscultation and percussion with normal breathing effort HEART: regular rate & rhythm and no murmurs and no lower extremity edema ABDOMEN:abdomen soft, non-tender and normal bowel sounds MUSCULOSKELETAL:no cyanosis of digits and no clubbing  NEURO: alert & oriented x 3 with fluent speech, no focal motor/sensory deficits EXTREMITIES: No lower extremity edema BREAST: No palpable masses or nodules in either right or left breasts. No palpable axillary supraclavicular or infraclavicular adenopathy no breast tenderness or nipple discharge. (exam performed in the presence of a chaperone)  LABORATORY DATA:  I have reviewed the data as listed   Chemistry      Component Value Date/Time   NA 139 10/06/2015 1617   K 4.2 10/06/2015 1617   CL 102 10/06/2015 1617   CO2 25 10/06/2015 1617   BUN 15 10/06/2015 1617   CREATININE 0.62 10/06/2015 1617      Component Value Date/Time   CALCIUM 9.1 10/06/2015 1617   ALKPHOS 97 10/06/2015 1617   AST 36 (H) 10/06/2015 1617   ALT 48 (H) 10/06/2015 1617   BILITOT 0.6 10/06/2015 1617       Lab Results  Component Value Date   WBC 4.9 09/08/2014   HGB 14.1 09/08/2014   HCT 43.0 09/08/2014   MCV 92.3 09/08/2014   PLT 181 09/08/2014      ASSESSMENT & PLAN:  Breast cancer of lower-outer quadrant of left female breast Left Lumpectomy 09/23/2014: IDC 1.1 cm, with Multifocal DCIS, 1 LN Micromets, margins neg T1CN73mc (Stage 1B) ER 100%, PR 30%, HER-2/neu negative, Ki-67 3%, grade 1, Oncotype DX low risk, scorel 15 (11% ROR) Status post adjuvant radiation 12/28/2014  Recommendation: Adjuvant antiestrogen therapy with anastrozole 1 mg daily since the patient is menopausal based on blood work done in August 2016. She stopped anastrozole in April 2017 because she felt that she needs Boniva and she could not take Boniva because of the teeth problems.  Bone density: Osteopenia T score -2.4  Antiestrogen treatment: I discussed the patient that she could consider taking tamoxifen at 10 mg dose to see if she tolerates it any better. She appears to be very much in size about the risk of stroke and blood clots with antiestrogen therapy. Especially tamoxifen. So she does not want to take any further antiestrogen therapy.  I made a compelling argument that she should consider taking some form of antiestrogen therapy.  it  appears that the patient feels that were all trying to push medication onto her for no good reason. At this point I decided that I would not force this issue any further.  Patient will go on surveillance from my standpoint with breast exams once a year starting in February 2017.   No orders of the defined types were placed in this encounter.  The patient has a good understanding of the overall plan. she agrees with it. she will call with any problems that may develop before the next visit here.   Rulon Eisenmenger, MD 10/17/15

## 2015-10-17 NOTE — Assessment & Plan Note (Signed)
Left Lumpectomy 09/23/2014: IDC 1.1 cm, with Multifocal DCIS, 1 LN Micromets, margins neg T1CN66mc (Stage 1B) ER 100%, PR 30%, HER-2/neu negative, Ki-67 3%, grade 1, Oncotype DX low risk, scorel 15 (11% ROR) Status post adjuvant radiation 12/28/2014  Recommendation: Adjuvant antiestrogen therapy with anastrozole 1 mg daily since the patient is menopausal based on blood work done in August 2016. She stopped anastrozole in April 2017 because she felt that she needs Boniva and she could not take Boniva because of the teeth problems.  Bone density: Osteopenia T score -2.4  Antiestrogen treatment: I discussed the patient that she could consider taking tamoxifen at 10 mg dose to see if she tolerates it any better. She appears to be very much in size about the risk of stroke and blood clots with antiestrogen therapy. Especially tamoxifen. So she does not want to take any further antiestrogen therapy.  I made a compelling argument that she should consider taking some form of antiestrogen therapy.  it appears that the patient feels that were all trying to push medication onto her for no good reason. At this point I decided that I would not force this issue any further.  Patient will go on surveillance from my standpoint with breast exams once a year starting in February 2017.

## 2015-10-27 DIAGNOSIS — R748 Abnormal levels of other serum enzymes: Secondary | ICD-10-CM | POA: Diagnosis not present

## 2015-10-28 LAB — COMPLETE METABOLIC PANEL WITH GFR
ALBUMIN: 4 g/dL (ref 3.6–5.1)
ALK PHOS: 83 U/L (ref 33–130)
ALT: 32 U/L — AB (ref 6–29)
AST: 25 U/L (ref 10–35)
BILIRUBIN TOTAL: 0.4 mg/dL (ref 0.2–1.2)
BUN: 12 mg/dL (ref 7–25)
CALCIUM: 8.9 mg/dL (ref 8.6–10.4)
CO2: 28 mmol/L (ref 20–31)
CREATININE: 0.55 mg/dL (ref 0.50–1.05)
Chloride: 105 mmol/L (ref 98–110)
GFR, Est Non African American: >89 mL/min (ref >=60)
Glucose, Bld: 81 mg/dL (ref 65–99)
Potassium: 3.8 mmol/L (ref 3.5–5.3)
Sodium: 140 mmol/L (ref 135–146)
TOTAL PROTEIN: 6.2 g/dL (ref 6.1–8.1)

## 2015-10-30 ENCOUNTER — Encounter: Payer: Self-pay | Admitting: Family Medicine

## 2015-11-28 ENCOUNTER — Encounter: Payer: Self-pay | Admitting: Emergency Medicine

## 2015-11-28 ENCOUNTER — Emergency Department
Admission: EM | Admit: 2015-11-28 | Discharge: 2015-11-28 | Disposition: A | Discharge status: Home / Self Care | Payer: 59 | Source: Home / Self Care | Attending: Family Medicine | Admitting: Family Medicine

## 2015-11-28 DIAGNOSIS — L089 Local infection of the skin and subcutaneous tissue, unspecified: Secondary | ICD-10-CM

## 2015-11-28 DIAGNOSIS — L723 Sebaceous cyst: Secondary | ICD-10-CM | POA: Diagnosis not present

## 2015-11-28 MED ORDER — CEPHALEXIN 500 MG PO CAPS: 500.0000 mg | ORAL_CAPSULE | Freq: Two times a day (BID) | ORAL | 0 refills | Status: DC

## 2015-11-28 NOTE — ED Provider Notes (Signed)
CSN: [id number]     Arrival date & time 11/28/15  1350 History   First MD Initiated Contact with Patient 11/28/15 1416     Chief Complaint  Patient presents with  . Abscess   (Consider location/radiation/quality/duration/timing/severity/associated sxs/prior Treatment) HPI Catherine Monroe is a 55 y.o. female presenting to UC with c/o worsening sebaceous cyst on her back that initially appeared 2 years ago.  Cyst has been increasingly more painful and draining small amount of pus over the last 2 days.  Pain is 2/10, aching.  She has not tried warm compresses or any other treatments as the local on her back is difficult for her to get to. Denies fever, chills, n/v/d.    Past Medical History:  Diagnosis Date  . Cancer [facility])    left breast cancer  . 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 Hypertension    Past Surgical History:  Procedure Laterality Date  . BIOPSY BREAST  08/10/14   Left  . BREAST CYST EXCISION     age 46, pilonidal cyst  . BREAST LUMPECTOMY     at age 64  . BREAST LUMPECTOMY WITH NEEDLE LOCALIZATION AND AXILLARY SENTINEL LYMPH NODE BX Left 09/23/2014   Procedure: LEFT BREAST LUMPECTOMY WITH BRACKETED  NEEDLE LOCALIZATION AND LEFT AXILLARY SENTINEL LYMPH NODE BX;  Surgeon: Alphonsa Overall, MD;  Location: [facility];  Service: General;  Laterality: Left;  . cyst removed     at age 24  . MASTOIDECTOMY  1970  . right knee scope for torn meniscus  2/10  . WISDOM TOOTH EXTRACTION     Family History  Problem Relation Age of Onset  . Hypertension Mother   . Hypertension Father   . Stroke Father   . Prostate cancer Father   . Sudden death Neg Hx   . Hyperlipidemia Neg Hx   . Heart attack Neg Hx   . Diabetes Neg Hx    Social History  Substance Use Topics  . Smoking status: Former Smoker    Packs/day: 1.00    Types: Cigarettes    Quit date: 01/29/1987  . Smokeless  tobacco: Never Used  . Alcohol use No   OB History    No data available     Review of Systems  Constitutional: Negative for chills and fever.  Gastrointestinal: Negative for nausea and vomiting.  Skin: Positive for wound. Negative for color change and rash.    Allergies  Penicillins  Home Medications   Prior to Admission medications   Medication Sig Start Date End Date Taking? Authorizing Provider  cephALEXin (KEFLEX) 500 MG capsule Take 1 capsule (500 mg total) by mouth 2 (two) times daily. For 7 days 11/28/15   Noland Fordyce, PA-C  Magnesium 100 MG CAPS Take 4 capsules by mouth daily.     Historical Provider, MD  Melatonin 2.5 MG CAPS Take 1 capsule by mouth at bedtime.     Historical Provider, MD  NEOMYCIN-POLYMYXIN-HYDROCORTISONE (CORTISPORIN) 1 % SOLN otic solution 3 DROPS IN BOTH EARS 3 TIMES/DAY X 1 WK 09/20/14   Historical Provider, MD   Meds Ordered and Administered this Visit  Medications - No data to display  BP 150/84 (BP Location: Right Arm)   Pulse 70   Temp 98 F (36.7 C) (Oral)   Ht 5\' 1"  (1.549 m)   Wt 113 lb (51.3 kg)  LMP 08/31/2011   SpO2 100%   BMI 21.35 kg/m  No data found.   Physical Exam  Constitutional: She is oriented to person, place, and time. She appears well-developed and well-nourished. No distress.  HENT:  Head: Normocephalic and atraumatic.  Eyes: EOM are normal.  Neck: Normal range of motion.  Cardiovascular: Normal rate.   Pulmonary/Chest: Effort normal.  Musculoskeletal: Normal range of motion.  Neurological: She is alert and oriented to person, place, and time.  Skin: Skin is warm and dry. She is not diaphoretic. There is erythema.     Left mid-back: 2cm area of faint erythema, tenderness, induration and centralized opening draining scant amount of thick white discharge. No bleeding. No red streaking.   Psychiatric: She has a normal mood and affect. Her behavior is normal.  Nursing note and vitals reviewed.   Urgent Care  Course   Clinical Course    Procedures (including critical care time)  Labs Review Labs Reviewed - No data to display  Imaging Review No results found.   MDM   1. Infected sebaceous cyst    Pt presenting to UC with infected sebaceous cyst on back. Cyst is draining spontaneously. No indication for I&D at this time.  Recommend pt start taking Keflex to cover for underlying infection given new onset pain and drainage of cyst.  Pt requesting to have cyst removed. Advised to call PCP for f/u visit for cyst removal. Encouraged to use warm compresses and take antibiotics as prescribed.  Patient verbalized understanding and agreement with treatment plan.     Noland Fordyce, PA-C 11/28/15 1505

## 2015-11-28 NOTE — Discharge Instructions (Signed)
°  You may try warm compresses such as a warm damp washcloth, heating pad, or warm shower on your cyst 2-3 times a day to help it continue to drain.    If you continue to have pain, worsening swelling, redness, or fever, please be sure to start the antibiotics and take the entire course of antibiotics to help treat the skin infection.  If you develop a rash, swelling, or other concerning symptoms related to taking the antibiotic, please stop taking the medication and call our office so a new medication can be prescribed.   You would likely benefit from having the cyst completely removed, however, you will need to call your primary care provider's office to schedule a 30 minute patient visit, which may take a few weeks.

## 2015-11-28 NOTE — ED Triage Notes (Signed)
Sebaceous cyst on back x 2 years started draining 2 days ago. Slightly painful 2/10

## 2015-11-29 ENCOUNTER — Encounter: Payer: Self-pay | Admitting: Sports Medicine

## 2015-11-29 ENCOUNTER — Ambulatory Visit (INDEPENDENT_AMBULATORY_CARE_PROVIDER_SITE_OTHER): Payer: 59 | Admitting: Sports Medicine

## 2015-11-29 DIAGNOSIS — L723 Sebaceous cyst: Secondary | ICD-10-CM

## 2015-11-29 MED ORDER — HYDROCODONE-ACETAMINOPHEN 5-325 MG PO TABS: 1.0000 | ORAL_TABLET | Freq: Three times a day (TID) | ORAL | 0 refills | Status: DC | PRN

## 2015-11-29 MED ORDER — FLUCONAZOLE 150 MG PO TABS: 150.0000 mg | ORAL_TABLET | Freq: Once | ORAL | 0 refills | Status: AC

## 2015-11-29 MED ORDER — DOXYCYCLINE HYCLATE 100 MG PO TABS: 100.0000 mg | ORAL_TABLET | Freq: Two times a day (BID) | ORAL | 0 refills | Status: AC

## 2015-11-29 NOTE — Progress Notes (Signed)
   Subjective:    I'm seeing this patient as a consultation for:   Noland Fordyce PA-C, Dr. [name]  CC: Sebaceous cyst  HPI: This is a pleasant 55 year old female, post breast cancer surgery, for sometime she's had a sebaceous cyst that intermittently drains foul-smelling fluid, she has had an incision and drainage but has never had a cyst excision. More recently she had an increase in drainage and went to urgent care, she was prescribed Keflex which she did not take, and referred to me for further evaluation and definitive treatment, pain is minimal.  Past medical history:  Negative.  See flowsheet/record as well for more information.  Surgical history: Negative.  See flowsheet/record as well for more information.  Family history: Negative.  See flowsheet/record as well for more information.  Social history: Negative.  See flowsheet/record as well for more information.  Allergies, and medications have been entered into the medical record, reviewed, and no changes needed.   Review of Systems: No headache, visual changes, nausea, vomiting, diarrhea, constipation, dizziness, abdominal pain, skin rash, fevers, chills, night sweats, weight loss, swollen lymph nodes, body aches, joint swelling, muscle aches, chest pain, shortness of breath, mood changes, visual or auditory hallucinations.   Objective:   General: Well Developed, well nourished, and in no acute distress.  Neuro/Psych: Alert and oriented x3, extra-ocular muscles intact, able to move all 4 extremities, sensation grossly intact. Skin: Warm and dry, no rashes noted. There is a 3cm sebaceous cyst just left of the midline of the mid back. Respiratory: Not using accessory muscles, speaking in full sentences, trachea midline.  Cardiovascular: Pulses palpable, no extremity edema. Abdomen: Does not appear distended.  Procedure:  Excision of back sebaceous cyst, 3 cm Risks, benefits, and alternatives explained and consent  obtained. Time out conducted. Surface prepped with alcohol. 10cc lidocaine with epinephine infiltrated in a field block. Adequate anesthesia ensured. Area prepped and draped in a sterile fashion. Excision performed with: Using a #15 blade I made a longitudinal incision over the sebaceous cyst and then using both sharp and blunt dissection I was able to encircle cyst, down to the subcutaneous tissue. The cyst was removed in block with only minimal drainage of sebaceous substance, and I then explored the incision with my finger, to ensure that I removed all of the cyst capsule.  I then closed the incision with a 0 Prolene horizontal mattress reinforcing suture to close the cavity and reduce the chance of seroma formation, and then placed #4 3-0 Ethilon sutures in a simple interrupted pattern to approximate the edges of the wound. Hemostasis achieved. Pt stable.  Impression and Recommendations:   This case required medical decision making of moderate complexity.  Sebaceous cyst Status post I&D in the past, did not take antibiotics prescribed by urgent care. Surgical excision as above, doxycycline, Diflucan. Hydrocodone for pain. Return to see me in 10 days for evaluation for suture removal.

## 2015-11-29 NOTE — Assessment & Plan Note (Signed)
Status post I&D in the past, did not take antibiotics prescribed by urgent care. Surgical excision as above, doxycycline, Diflucan. Hydrocodone for pain. Return to see me in 10 days for evaluation for suture removal.

## 2015-12-11 ENCOUNTER — Encounter: Payer: Self-pay | Admitting: Sports Medicine

## 2015-12-11 ENCOUNTER — Ambulatory Visit (INDEPENDENT_AMBULATORY_CARE_PROVIDER_SITE_OTHER): Payer: 59 | Admitting: Sports Medicine

## 2015-12-11 DIAGNOSIS — L918 Other hypertrophic disorders of the skin: Secondary | ICD-10-CM | POA: Diagnosis not present

## 2015-12-11 DIAGNOSIS — L723 Sebaceous cyst: Secondary | ICD-10-CM | POA: Diagnosis not present

## 2015-12-11 NOTE — Progress Notes (Signed)
  Subjective:    CC: Follow-up  HPI: Skin tag: Would like cryotherapy, this is present at her bra line, and very irritating, occasionally bleeds.  Postop: 1 week ago we did a large sebaceous cyst excision with primary closure, she's doing extremely well. Here for suture removal.  Past medical history:  Negative.  See flowsheet/record as well for more information.  Surgical history: Negative.  See flowsheet/record as well for more information.  Family history: Negative.  See flowsheet/record as well for more information.  Social history: Negative.  See flowsheet/record as well for more information.  Allergies, and medications have been entered into the medical record, reviewed, and no changes needed.   Review of Systems: No fevers, chills, night sweats, weight loss, chest pain, or shortness of breath.   Objective:    General: Well Developed, well nourished, and in no acute distress.  Neuro: Alert and oriented x3, extra-ocular muscles intact, sensation grossly intact.  HEENT: Normocephalic, atraumatic, pupils equal round reactive to light, neck supple, no masses, no lymphadenopathy, thyroid nonpalpable.  Skin: Warm and dry, no rashes.Single skin tag on the back. Incision from the sebaceous cyst removal is clean, dry, intact. All sutures removed and a bit of Dermabond with Steri-Strips applied, she does plan to get back aggressive into yoga Cardiac: Regular rate and rhythm, no murmurs rubs or gallops, no lower extremity edema.  Respiratory: Clear to auscultation bilaterally. Not using accessory muscles, speaking in full sentences.  Procedure:  Cryodestruction of skin tag on the mid back Consent obtained and verified. Time-out conducted. Noted no overlying erythema, induration, or other signs of local infection. Completed without difficulty using Cryo-Gun. Advised to call if fevers/chills, erythema, induration, drainage, or persistent bleeding.  Impression and Recommendations:     Sebaceous cyst Sutures removed today, Steri-Strips with Dermabond, no restrictions.  Skin tag Mid back, cryotherapy as above.

## 2015-12-11 NOTE — Assessment & Plan Note (Signed)
Sutures removed today, Steri-Strips with Dermabond, no restrictions.

## 2015-12-11 NOTE — Assessment & Plan Note (Signed)
Mid back, cryotherapy as above.

## 2015-12-31 IMAGING — MG MM DIAG BREAST TOMO UNI LEFT
2 series · 2 of 2 positions shown · non-contrast
Comparison: Previous exam(s) including recent screening mammogram
07/21/2014.

CLINICAL DATA: Possible mass identified within the left breast on
recent screening mammogram.

EXAM:
DIGITAL DIAGNOSTIC LEFT MAMMOGRAM WITH 3D TOMOSYNTHESIS WITH CAD
ULTRASOUND LEFT BREAST

[L CC]
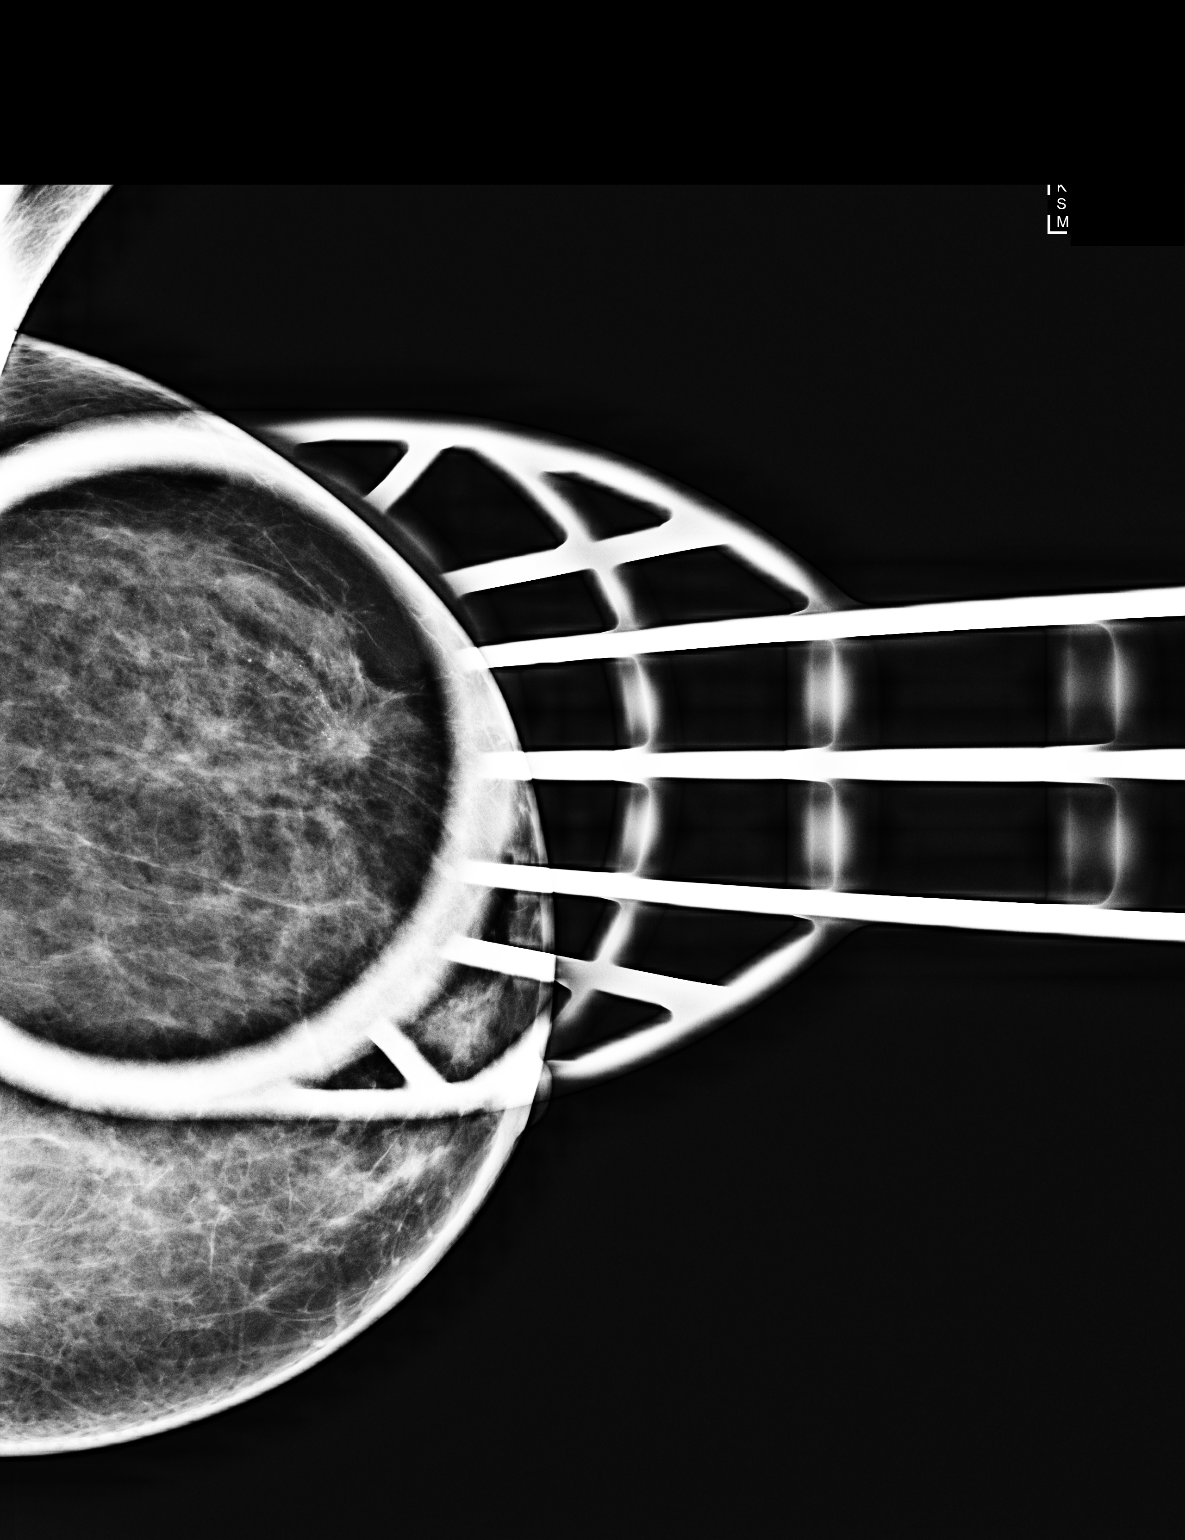

[L CC tomo]
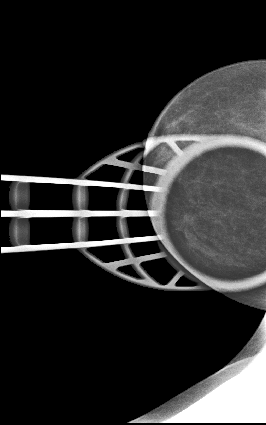

[2 of 2 positions shown; findings below may reference images not displayed]

ACR Breast Density Category c: The breast tissue is heterogeneously
dense, which may obscure small masses.
FINDINGS: On today's additional spot compression views with tomosynthesis,
there is a persistent mass with spiculated margins and associated
microcalcifications within the lower outer quadrant of the left
breast, 4 o'clock axis region, at middle depth. There are associated
fine pleomorphic calcifications both within and posterior
posterior-lateral to the mass which span 3 cm.

Mammographic images were processed with CAD.

On physical exam, there is localized soft tissue thickening within
the lower outer quadrant of the left breast. No other palpable
abnormality identified.

Targeted ultrasound is performed, showing an irregular hypoechoic
mass within the left breast at the 4 o'clock axis, lower outer
quadrant, 3 cm from the nipple, measuring 1.3 x 1.3 x 0.6 cm, with
associated echogenic foci, corresponding to the spiculated mass and
microcalcifications seen on mammogram.
IMPRESSION: Spiculated mass within the left breast at the 4 o'clock axis, lower
outer quadrant, 3 cm from the nipple, measuring 1.3 x 1.3 x 0.6 cm.
There are associated fine pleomorphic microcalcifications both
within the mass and posterior-lateral to the mass spanning 3 cm.
This is a suspicious finding for which ultrasound-guided core biopsy
is recommended.

RECOMMENDATION:
Ultrasound-guided core biopsy of the left breast mass, lower outer
quadrant, 4 o'clock axis region. Ultrasound-guided biopsy was
performed later same day. Please see the separate ultrasound-guided
biopsy report for details.

I have discussed the findings and recommendations with the patient.
Results were also provided in writing at the conclusion of the
visit. If applicable, a reminder letter will be sent to the patient
regarding the next appointment.

BI-RADS CATEGORY  4: Suspicious.

## 2016-02-06 DIAGNOSIS — H95111 Chronic inflammation of postmastoidectomy cavity, right ear: Secondary | ICD-10-CM | POA: Diagnosis not present

## 2016-02-06 DIAGNOSIS — H9011 Conductive hearing loss, unilateral, right ear, with unrestricted hearing on the contralateral side: Secondary | ICD-10-CM | POA: Diagnosis not present

## 2016-02-06 DIAGNOSIS — H9501 Recurrent cholesteatoma of postmastoidectomy cavity, right ear: Secondary | ICD-10-CM | POA: Diagnosis not present

## 2016-02-20 IMAGING — US US BREAST LTD UNI LEFT INC AXILLA
1 series · 13 of 22 positions shown · non-contrast
Comparison: Previous exam(s) including recent screening mammogram
07/21/2014.

CLINICAL DATA: Possible mass identified within the left breast on
recent screening mammogram.

EXAM:
DIGITAL DIAGNOSTIC LEFT MAMMOGRAM WITH 3D TOMOSYNTHESIS WITH CAD
ULTRASOUND LEFT BREAST

[Series 1: us breast ltd uni left inc axilla · 0.07mm/px · 13 of 22 slices shown]
[im 1/22]
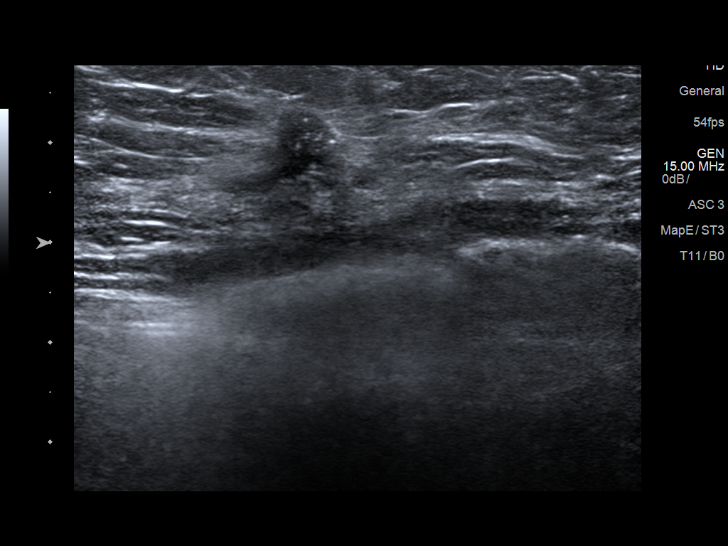
[im 3/22]
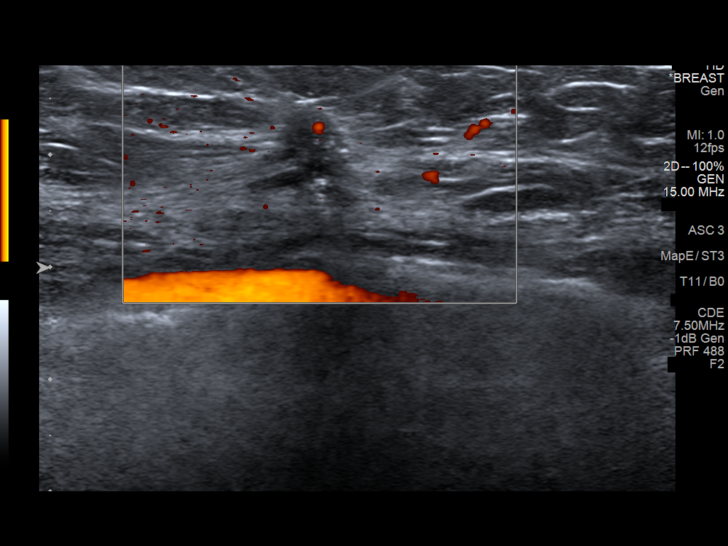
[im 5/22]
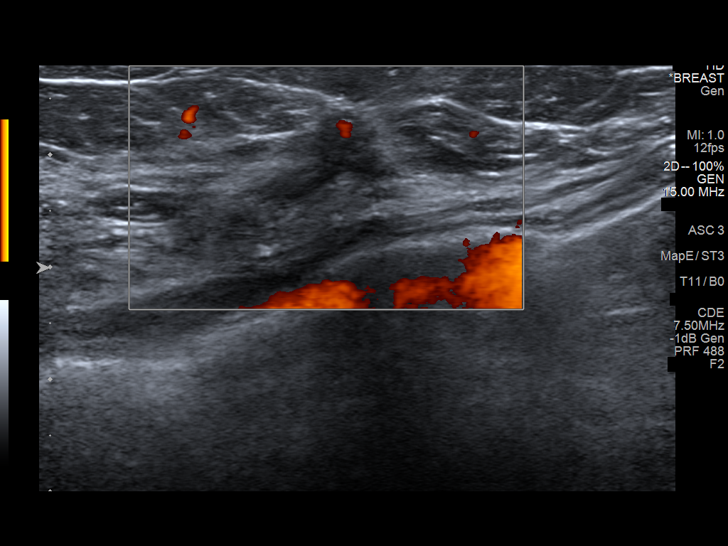
[im 6/22]
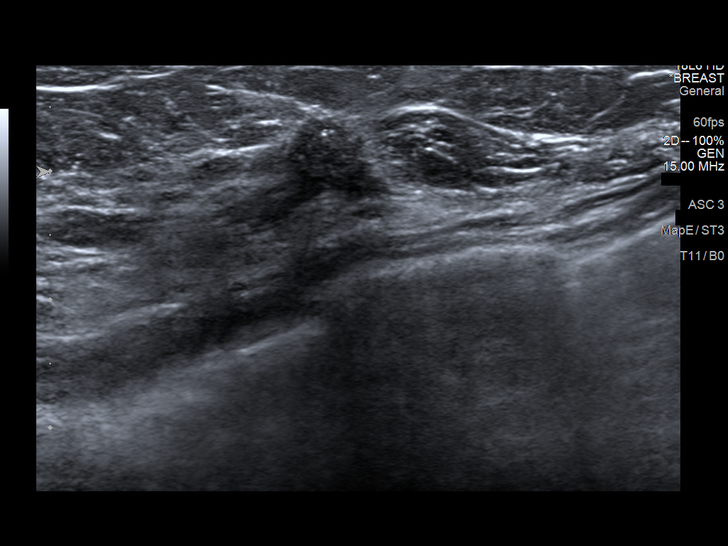
[im 8/22]
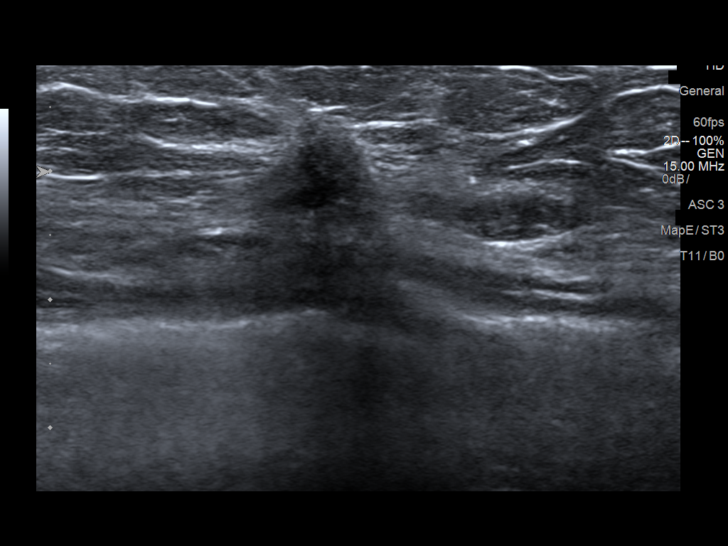
[im 10/22]
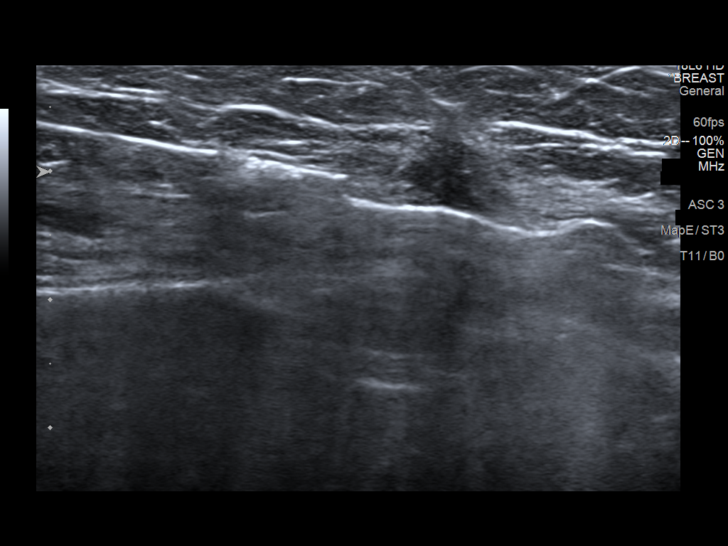
[im 12/22]
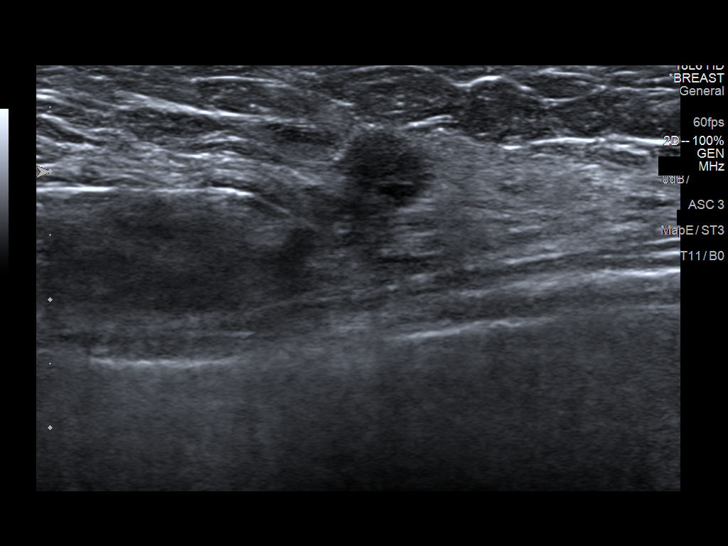
[im 13/22]
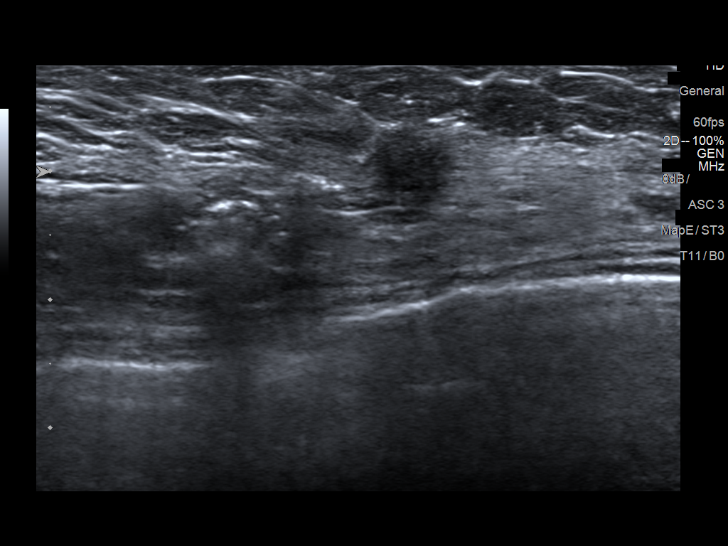
[im 15/22]
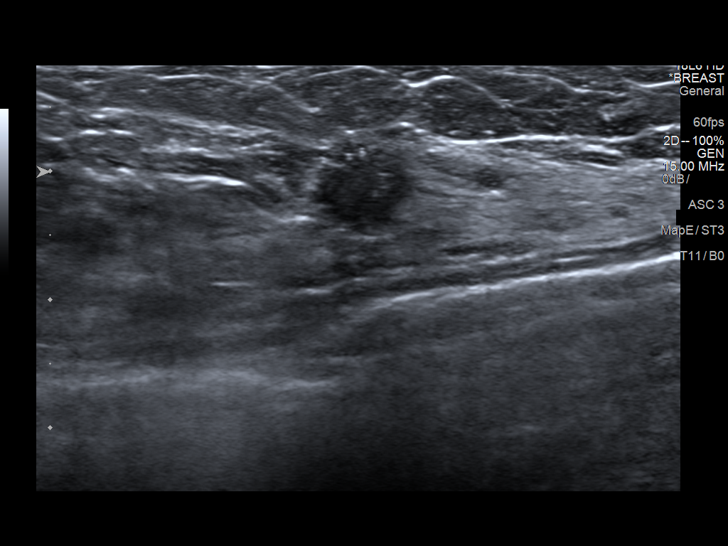
[im 17/22]
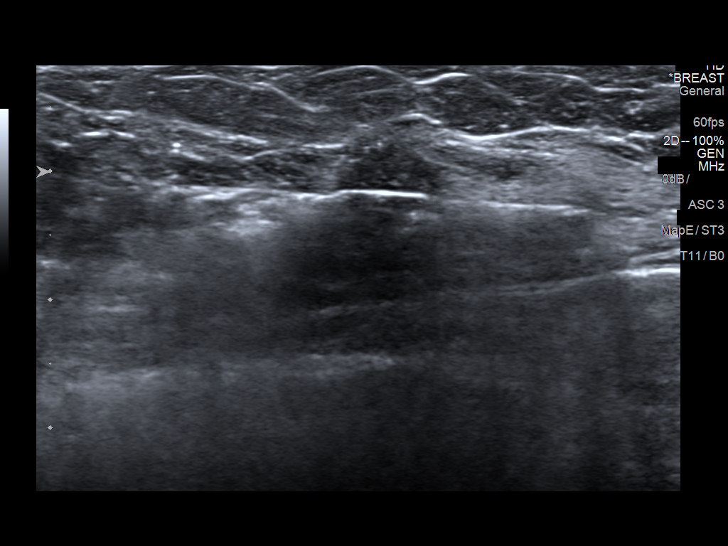
[im 18/22]
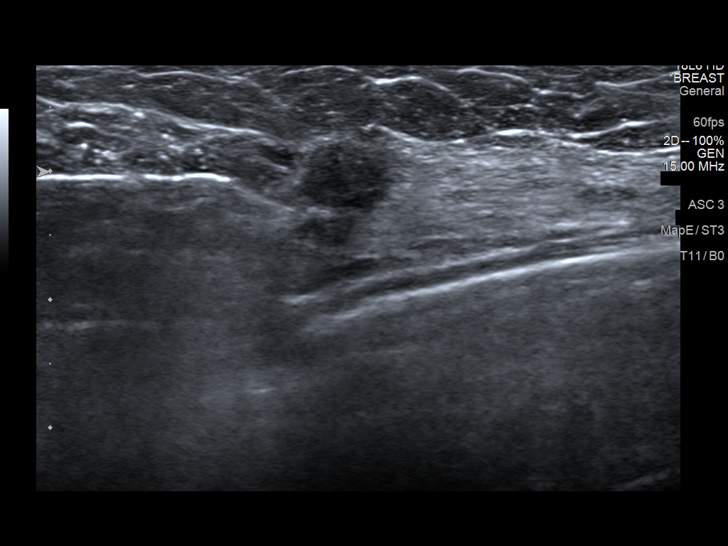
[im 20/22]
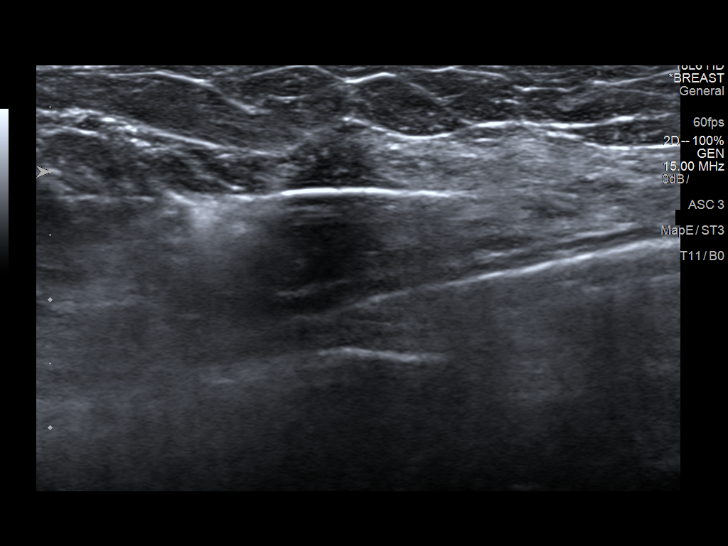
[im 22/22]
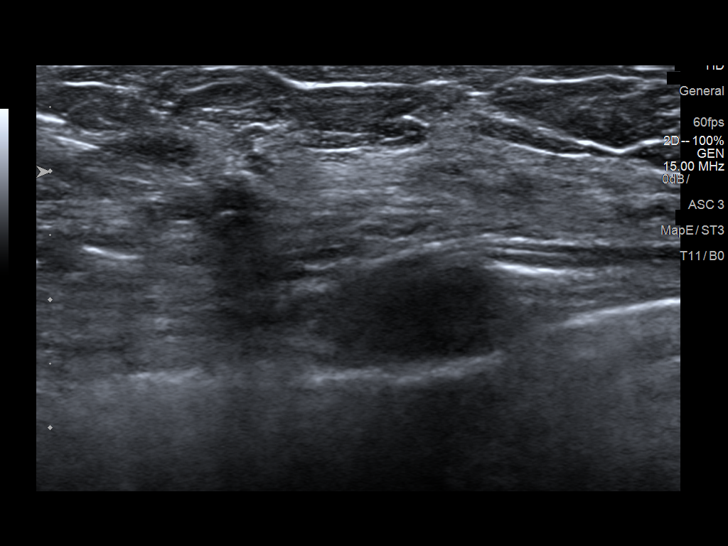

[13 of 22 positions shown; findings below may reference images not displayed]

ACR Breast Density Category c: The breast tissue is heterogeneously
dense, which may obscure small masses.
FINDINGS: On today's additional spot compression views with tomosynthesis,
there is a persistent mass with spiculated margins and associated
microcalcifications within the lower outer quadrant of the left
breast, 4 o'clock axis region, at middle depth. There are associated
fine pleomorphic calcifications both within and posterior
posterior-lateral to the mass which span 3 cm.

Mammographic images were processed with CAD.

On physical exam, there is localized soft tissue thickening within
the lower outer quadrant of the left breast. No other palpable
abnormality identified.

Targeted ultrasound is performed, showing an irregular hypoechoic
mass within the left breast at the 4 o'clock axis, lower outer
quadrant, 3 cm from the nipple, measuring 1.3 x 1.3 x 0.6 cm, with
associated echogenic foci, corresponding to the spiculated mass and
microcalcifications seen on mammogram.
IMPRESSION: Spiculated mass within the left breast at the 4 o'clock axis, lower
outer quadrant, 3 cm from the nipple, measuring 1.3 x 1.3 x 0.6 cm.
There are associated fine pleomorphic microcalcifications both
within the mass and posterior-lateral to the mass spanning 3 cm.
This is a suspicious finding for which ultrasound-guided core biopsy
is recommended.

RECOMMENDATION:
Ultrasound-guided core biopsy of the left breast mass, lower outer
quadrant, 4 o'clock axis region. Ultrasound-guided biopsy was
performed later same day. Please see the separate ultrasound-guided
biopsy report for details.

I have discussed the findings and recommendations with the patient.
Results were also provided in writing at the conclusion of the
visit. If applicable, a reminder letter will be sent to the patient
regarding the next appointment.

BI-RADS CATEGORY  4: Suspicious.

## 2016-02-28 DIAGNOSIS — H527 Unspecified disorder of refraction: Secondary | ICD-10-CM | POA: Diagnosis not present

## 2016-02-28 DIAGNOSIS — H524 Presbyopia: Secondary | ICD-10-CM | POA: Diagnosis not present

## 2016-03-04 ENCOUNTER — Telehealth: Payer: Self-pay | Admitting: Hematology and Oncology

## 2016-03-04 NOTE — Telephone Encounter (Signed)
Pt called to cxl follow up appt on 2/19. Pt did not want to r/s at this time

## 2016-03-18 ENCOUNTER — Ambulatory Visit: Payer: 59 | Admitting: Hematology and Oncology

## 2016-07-15 ENCOUNTER — Other Ambulatory Visit: Payer: Self-pay | Admitting: Surgery

## 2016-07-15 DIAGNOSIS — Z853 Personal history of malignant neoplasm of breast: Secondary | ICD-10-CM

## 2016-08-27 ENCOUNTER — Ambulatory Visit
Admission: RE | Admit: 2016-08-27 | Discharge: 2016-08-27 | Disposition: A | Discharge status: Home / Self Care | Payer: 59 | Source: Ambulatory Visit | Attending: Surgery | Admitting: Surgery

## 2016-08-27 DIAGNOSIS — Z853 Personal history of malignant neoplasm of breast: Secondary | ICD-10-CM

## 2016-08-27 DIAGNOSIS — R922 Inconclusive mammogram: Secondary | ICD-10-CM | POA: Diagnosis not present

## 2016-08-27 HISTORY — DX: Malignant neoplasm of unspecified site of unspecified female breast: C50.919

## 2016-09-18 DIAGNOSIS — H9011 Conductive hearing loss, unilateral, right ear, with unrestricted hearing on the contralateral side: Secondary | ICD-10-CM | POA: Diagnosis not present

## 2016-09-18 DIAGNOSIS — H9501 Recurrent cholesteatoma of postmastoidectomy cavity, right ear: Secondary | ICD-10-CM | POA: Diagnosis not present

## 2016-09-18 DIAGNOSIS — H95111 Chronic inflammation of postmastoidectomy cavity, right ear: Secondary | ICD-10-CM | POA: Diagnosis not present

## 2016-10-02 DIAGNOSIS — H9011 Conductive hearing loss, unilateral, right ear, with unrestricted hearing on the contralateral side: Secondary | ICD-10-CM | POA: Diagnosis not present

## 2016-10-31 ENCOUNTER — Encounter: Payer: 59 | Admitting: Family Medicine

## 2016-11-20 ENCOUNTER — Ambulatory Visit (INDEPENDENT_AMBULATORY_CARE_PROVIDER_SITE_OTHER): Payer: 59 | Admitting: Family Medicine

## 2016-11-20 ENCOUNTER — Encounter: Payer: Self-pay | Admitting: Family Medicine

## 2016-11-20 VITALS — BP 134/68 | HR 68 | Ht 61.0 in | Wt 121.0 lb

## 2016-11-20 DIAGNOSIS — L570 Actinic keratosis: Secondary | ICD-10-CM

## 2016-11-20 DIAGNOSIS — L821 Other seborrheic keratosis: Secondary | ICD-10-CM | POA: Diagnosis not present

## 2016-11-20 DIAGNOSIS — Z0000 Encounter for general adult medical examination without abnormal findings: Secondary | ICD-10-CM

## 2016-11-20 LAB — COMPLETE METABOLIC PANEL WITH GFR
AG RATIO: 2 (calc) (ref 1.0–2.5)
ALT: 29 U/L (ref 6–29)
AST: 25 U/L (ref 10–35)
Albumin: 4.5 g/dL (ref 3.6–5.1)
Alkaline phosphatase (APISO): 95 U/L (ref 33–130)
BILIRUBIN TOTAL: 0.8 mg/dL (ref 0.2–1.2)
BUN: 9 mg/dL (ref 7–25)
CALCIUM: 9.3 mg/dL (ref 8.6–10.4)
CO2: 30 mmol/L (ref 20–32)
Chloride: 103 mmol/L (ref 98–110)
Creat: 0.66 mg/dL (ref 0.50–1.05)
GFR, EST NON AFRICAN AMERICAN: 100 mL/min/{1.73_m2} (ref >=60)
GFR, Est African American: 116 mL/min/{1.73_m2} (ref >=60)
Globulin: 2.3 g/dL (calc) (ref 1.9–3.7)
Glucose, Bld: 99 mg/dL (ref 65–99)
POTASSIUM: 4 mmol/L (ref 3.5–5.3)
Sodium: 140 mmol/L (ref 135–146)
Total Protein: 6.8 g/dL (ref 6.1–8.1)

## 2016-11-20 LAB — CBC
HCT: 39.9 % (ref 35.0–45.0)
HEMOGLOBIN: 13.6 g/dL (ref 11.7–15.5)
MCH: 29.6 pg (ref 27.0–33.0)
MCHC: 34.1 g/dL (ref 32.0–36.0)
MCV: 86.9 fL (ref 80.0–100.0)
MPV: 11.1 fL (ref 7.5–12.5)
Platelets: 191 10*3/uL (ref 140–400)
RBC: 4.59 10*6/uL (ref 3.80–5.10)
RDW: 12.2 % (ref 11.0–15.0)
WBC: 4.1 10*3/uL (ref 3.8–10.8)

## 2016-11-20 LAB — LIPID PANEL W/REFLEX DIRECT LDL
Cholesterol: 178 mg/dL (ref <200)
HDL: 68 mg/dL (ref >50)
LDL Cholesterol (Calc): 90 mg/dL (calc)
NON-HDL CHOLESTEROL (CALC): 110 mg/dL (ref <130)
Total CHOL/HDL Ratio: 2.6 (calc) (ref <5.0)
Triglycerides: 107 mg/dL (ref <150)

## 2016-11-20 NOTE — Progress Notes (Signed)
All labs are normal. 

## 2016-11-20 NOTE — Progress Notes (Signed)
Subjective:     Catherine Monroe is a 56 y.o. female and is here for a comprehensive physical exam. The patient reports no problems.  She exercises and does hot yoga regularly.  She feels like she eats very healthy overall.  She really tries to do a lot of self-care.  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  She does have a couple of lesions between her breasts and one on her left upper back that she would like to have frozen off today if possible.  Social History   Social History  . Marital status: Married    Spouse name: N/A  . Number of children: 2  . Years of education: N/A   Occupational History  . Nurse    Social History Main Topics  . Smoking status: Former Smoker    Packs/day: 1.00    Types: Cigarettes    Quit date: 01/29/1987  . Smokeless tobacco: Never Used  . Alcohol use No  . Drug use: No  . Sexual activity: Yes    Partners: Male   Other Topics Concern  . Not on file   Social History Narrative   Regular exercise. Runs 3 miles per day and does hot yoga.     Health Maintenance  Topic Date Due  . COLONOSCOPY  07/28/2012  . MAMMOGRAM  08/28/2018  . PAP SMEAR  10/06/2019  . TETANUS/TDAP  03/19/2021  . INFLUENZA VACCINE  Completed  . Hepatitis C Screening  Completed  . HIV Screening  Completed    The following portions of the patient's history were reviewed and updated as appropriate: allergies, current medications, past family history, past medical history, past social history, past surgical history and problem list.  Review of Systems A comprehensive review of systems was negative.   Objective:    BP 134/68   Pulse 68   Ht 5\' 1"  (1.549 m)   Wt 121 lb (54.9 kg)   LMP 08/31/2011   SpO2 100%   BMI 22.86 kg/m  General appearance: alert, cooperative and appears stated age Head: Normocephalic, without obvious abnormality, atraumatic Eyes: conj claer, EOMI, PEERLA Ears: normal TM's and external ear canals both ears Nose: Nares  normal. Septum midline. Mucosa normal. No drainage or sinus tenderness. Throat: lips, mucosa, and tongue normal; teeth and gums normal Neck: no adenopathy, no carotid bruit, no JVD, supple, symmetrical, trachea midline and thyroid not enlarged, symmetric, no tenderness/mass/nodules Back: symmetric, no curvature. ROM normal. No CVA tenderness. Lungs: clear to auscultation bilaterally Breasts: normal appearance, no masses or tenderness Heart: regular rate and rhythm, S1, S2 normal, no murmur, click, rub or gallop Abdomen: soft, non-tender; bowel sounds normal; no masses,  no organomegaly Extremities: extremities normal, atraumatic, no cyanosis or edema Pulses: 2+ and symmetric Skin: Skin color, texture, turgor normal. No rashes or lesions Lymph nodes: Cervical, supraclavicular, and axillary nodes normal. Neurologic: Alert and oriented X 3, normal strength and tone. Normal symmetric reflexes. Normal coordination and gait    Assessment:    Healthy female exam.      Plan:     See After Visit Summary for Counseling Recommendations   Keep up a regular exercise program and make sure you are eating a healthy diet Try to eat 4 servings of dairy a day, or if you are lactose intolerant take a calcium with vitamin D daily.  Your vaccines are up to date.    Will likely need a referral to Dr. Elwyn Reach for her  ear for 2019.  She sees him every 6 months for debriding of her right ear.  She would just contact me when she needs the referral.  On exam today and noted that she had a erythematous scaly lesion on her left arm most consistent with an actinic keratoses.  She said it had been there for years and had not really changed but it never heals.  Recommended cryotherapy for definitive treatment.  Encouraged her to call me if the lesion returns that we can do a shave biopsy.  Cryotherapy Procedure Note  Pre-operative Diagnosis: Actinic keratosis,   Post-operative Diagnosis: same  Locations: left  forearm  Indications: pre-cancerous   Anesthesia: not required    Procedure Details  Patient informed of risks (permanent scarring, infection, light or dark discoloration, bleeding, infection, weakness, numbness and recurrence of the lesion) and benefits of the procedure and verbal informed consent obtained.  The areas are treated with liquid nitrogen therapy, frozen until ice ball extended 2 mm beyond lesion, allowed to thaw, and treated again. The patient tolerated procedure well.  The patient was instructed on post-op care, warned that there may be blister formation, redness and pain. Recommend OTC analgesia as needed for pain.  Condition: Stable  Complications: none.  Plan: 1. Instructed to keep the area dry and covered for 24-48h and clean thereafter. 2. Warning signs of infection were reviewed.   3. Recommended that the patient use OTC acetaminophen as needed for pain.  4. Return PRN.      Cryotherapy Procedure Note  Pre-operative Diagnosis: Seborrheic keratoses  Post-operative Diagnosis: same  Locations: between the breast 3 lesions, and one on upper left back  Indications: irritation   Anesthesia: not required    Procedure Details  Patient informed of risks (permanent scarring, infection, light or dark discoloration, bleeding, infection, weakness, numbness and recurrence of the lesion) and benefits of the procedure and verbal informed consent obtained.  The areas are treated with liquid nitrogen therapy, frozen until ice ball extended 2 mm beyond lesion, allowed to thaw, and treated again. The patient tolerated procedure well.  The patient was instructed on post-op care, warned that there may be blister formation, redness and pain. Recommend OTC analgesia as needed for pain.  Condition: Stable  Complications: none.  Plan: 1. Instructed to keep the area dry and covered for 24-48h and clean thereafter. 2. Warning signs of infection were reviewed.   3.  Recommended that the patient use OTC acetaminophen as needed for pain.  4. Return PRN

## 2016-11-20 NOTE — Patient Instructions (Addendum)

## 2016-11-21 ENCOUNTER — Encounter: Payer: Self-pay | Admitting: Family Medicine

## 2017-01-07 DIAGNOSIS — H95191 Other disorders following mastoidectomy, right ear: Secondary | ICD-10-CM | POA: Diagnosis not present

## 2017-01-07 DIAGNOSIS — H9011 Conductive hearing loss, unilateral, right ear, with unrestricted hearing on the contralateral side: Secondary | ICD-10-CM | POA: Diagnosis not present

## 2017-01-08 NOTE — Pre-Procedure Instructions (Signed)
JASENIA WEILBACHER  01/08/2017      [id number] - [place],  - [address] [place] [address] Phone: [phone] Fax: [phone]  [place], [place] - [address] [place] [address] Phone: [phone] Fax: [phone]    Your procedure is scheduled on Thursday, January 16, 2017  Report to [facility] Admitting Entrance "A" at 7:00AM   Call this number if you have problems the morning of surgery:  [phone]   Remember:  Do not eat food or drink liquids after midnight.  Take these medicines the morning of surgery with A SIP OF WATER: NONE  As of today, stop taking all Aspirins, Vitamins, Fish oils, and Herbal medications. Also stop all NSAIDS i.e. Advil, Ibuprofen, Motrin, Aleve, Anaprox, Naproxen, BC and Goody Powders.   Do not wear jewelry, make-up or nail polish.  Do not wear lotions, powders, perfumes, or deodorant.  Do not shave 48 hours prior to surgery.   Do not bring valuables to the hospital.  [facility] is not responsible for any belongings or valuables.  Contacts, dentures or bridgework may not be worn into surgery.  Leave your suitcase in the car.  After surgery it may be brought to your room.  For patients admitted to the hospital, discharge time will be determined by your treatment team.  Patients discharged the day of surgery will not be allowed to drive home.   Special instructions:   [place]- Preparing For Surgery  Before surgery, you can play an important role. Because skin is not sterile, your skin needs to be as free of germs as possible. You can reduce the number of germs on your skin by washing with CHG (chlorahexidine gluconate) Soap before surgery.  CHG is an antiseptic cleaner which kills germs and bonds with the skin to continue killing germs even after washing.  Please do not use if you have an allergy to CHG or  antibacterial soaps. If your skin becomes reddened/irritated stop using the CHG.  Do not shave (including legs and underarms) for at least 48 hours prior to first CHG shower. It is OK to shave your face.  Please follow these instructions carefully.   1. Shower the NIGHT BEFORE SURGERY and the MORNING OF SURGERY with CHG.   2. If you chose to wash your hair, wash your hair first as usual with your normal shampoo.  3. After you shampoo, rinse your hair and body thoroughly to remove the shampoo.  4. Use CHG as you would any other liquid soap. You can apply CHG directly to the skin and wash gently with a scrungie or a clean washcloth.   5. Apply the CHG Soap to your body ONLY FROM THE NECK DOWN.  Do not use on open wounds or open sores. Avoid contact with your eyes, ears, mouth and genitals (private parts). Wash Face and genitals (private parts)  with your normal soap.  6. Wash thoroughly, paying special attention to the area where your surgery will be performed.  7. Thoroughly rinse your body with warm water from the neck down.  8. DO NOT shower/wash with your normal soap after using and rinsing off the CHG Soap.  9. Pat yourself dry with a CLEAN TOWEL.  10. Wear CLEAN PAJAMAS to bed the night before surgery, wear comfortable clothes the morning of surgery  11. Place CLEAN SHEETS on your bed the night of  your first shower and DO NOT SLEEP WITH PETS.  Day of Surgery: Do not apply any deodorants/lotions. Please wear clean clothes to the hospital/surgery center.    Please read over the following fact sheets that you were given. Pain Booklet, Coughing and Deep Breathing and Surgical Site Infection Prevention

## 2017-01-09 ENCOUNTER — Encounter (HOSPITAL_COMMUNITY)
Admission: RE | Admit: 2017-01-09 | Discharge: 2017-01-09 | Disposition: A | Discharge status: Home / Self Care | Payer: 59 | Source: Ambulatory Visit | Attending: Otolaryngology | Admitting: Otolaryngology

## 2017-01-09 ENCOUNTER — Encounter (HOSPITAL_COMMUNITY): Payer: Self-pay

## 2017-01-09 ENCOUNTER — Other Ambulatory Visit: Payer: Self-pay

## 2017-01-09 DIAGNOSIS — Z853 Personal history of malignant neoplasm of breast: Secondary | ICD-10-CM | POA: Diagnosis not present

## 2017-01-09 DIAGNOSIS — L918 Other hypertrophic disorders of the skin: Secondary | ICD-10-CM | POA: Diagnosis not present

## 2017-01-09 DIAGNOSIS — Z01812 Encounter for preprocedural laboratory examination: Secondary | ICD-10-CM | POA: Diagnosis not present

## 2017-01-09 LAB — CBC
HCT: 42.5 % (ref 36.0–46.0)
Hemoglobin: 13.8 g/dL (ref 12.0–15.0)
MCH: 29.4 pg (ref 26.0–34.0)
MCHC: 32.5 g/dL (ref 30.0–36.0)
MCV: 90.6 fL (ref 78.0–100.0)
PLATELETS: 224 10*3/uL (ref 150–400)
RBC: 4.69 MIL/uL (ref 3.87–5.11)
RDW: 12.8 % (ref 11.5–15.5)
WBC: 4.1 10*3/uL (ref 4.0–10.5)

## 2017-01-09 LAB — BASIC METABOLIC PANEL
ANION GAP: 9 (ref 5–15)
BUN: 9 mg/dL (ref 6–20)
CALCIUM: 9.2 mg/dL (ref 8.9–10.3)
CO2: 24 mmol/L (ref 22–32)
Chloride: 105 mmol/L (ref 101–111)
Creatinine, Ser: 0.65 mg/dL (ref 0.44–1.00)
GFR calc Af Amer: >60 mL/min (ref >60)
Glucose, Bld: 96 mg/dL (ref 65–99)
Potassium: 4 mmol/L (ref 3.5–5.1)
SODIUM: 138 mmol/L (ref 135–145)

## 2017-01-09 NOTE — Progress Notes (Signed)
PCP is Dr [name] Denies ever seeing a cardiologist. Denies having a card cath, stress test, or echo.

## 2017-01-10 NOTE — H&P (Signed)
Catherine Monroe is an 56 y.o. female.   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  HPI: See H&P below  History & Physical Examination   Patient:  Catherine Monroe  Date of Birth: October 13, 1960  Provider: Vicie Mutters, MD, MS, FACS  Date of Service:  Jan 07, 2017  Location: [facility], [place], [place]                  [place], [place]   [id number]                                Ph: [phone], Fax: [phone]                  [email or web address]     Provider: Vicie Mutters, MD, MS, FACS Encounter Date: Jan 07, 2017 Patient: Catherine, Monroe   ([id number]) Sex: Female       DOB: April 28, 1960      Age: 68 year 5 month 1 week       Race: White Address: [address]  [place]  [place]  [address]    Pref. Phone(C): [phone] Primary Dr.: Barnetta Chapel METHENY Insurance(s):  [id number]) (PP)   [place] CT: Type: procedure  code: [id number]  desc:Documentation of current medications (procedure)  Visit Type: Catherine Monroe, a 51 year 5 month 1 week White female is here today for a pre-operative visit.  Complaint/HPI: The patient was here today for a preoperative examination prior to undergoing a right Baha connect hearing implant for treatment of maximum conductive hearing loss AD following a mastoid procedure elsewhere. She denied any recent otorrhea, otalgia, cough, fever, or upper/lower respiratory tract infection. Procedure is scheduled for January 16, 2017 at [facility].  Previous history: The patient was here today for right mastoid cavity debridement and follow-up. She stated that she thought it was time to have her ear cleaned. She cannot localize and has not worn a hearing aid in the right ear. She denies otorrhea, tinnitus or vertigo.  Previous history: The patient was here today for cleaning of her right radical mastoid cavity. She denied any otorrhea, otalgia,  or vertigo. Her right mastoid procedure was performed by Dr. Rock Nephew many years ago. She is known to have a significant conductive hearing loss but has not been interested in either hearing aid amplification or a right Baha hearing implant.  Previous history: The patient was here today for routine cleaning of her right mastoid cavity. She states that she has not drained since I cleaned her cavity during her last visit. She was very happy about this. She does not hear from the right ear and is unable to localize sounds. She denied any symptoms of otalgia, vertigo, or tinnitus.  Previous history: The patient was here today for a routine right mastoidectomy cavity cleaning. She has continued intermittent otorrhea without otalgia, tinnitus or vertigo.  Previous history: The patient was here today for right mastoidectomy cavity cleaning. She continues to have intermittent otorrhea and some otalgia.  Previous history: The patient was here today complaining of persistent otorrhea from her right radical cavity. She had undergone a right radical mastoidectomy many years ago by Dr. Rock Nephew. She states that her ear has not stopped draining. Since her last visit.  It is causing her distress. Denies any other symptoms.  Previous history: The patient was seen today as an emergency work in for evaluation of bleeding from her right radical mastoid cavity. Patient had undergone a right radical mastoidectomy many years ago by Dr. Lester Kinsman. She does not return for routine cavity cleaning. She has been bleeding recently and requested an emergency workin visit. She denies otalgia, tinnitus or vertigo. She is known to have a maximum conductive hearing loss AD and has not been interested in wearing a hearing aid or pursuing a BAHA or Ponto hearing implant.   Current Medication: Other MD:  1. Magnesium 30 Mg Tablet   Medical History: Vaccinations: Flu vaccinations: Yes, flu shot - since Oct 28, 2016 - code [phone].  Ear  Operations: Marland Kitchen Mastoidectomy: .  Family History: The patient's family history is noncontributory.  Social History: No Traveled outside [place] in past 21 days? Adult. Smoking: Her current smoking status is never smoker/non-smoker. Alcohol: Patient denies drinking alcoholic beverages. Marital Status: Patient is married. HIV status: (-) HIV status. Military History: No. Recreational Drug Use: She denies recreational drug use.  Allergy: Penicillins  ROS: General: (-) fever, (-) chills, (-) night sweats, (-) fatigue, (-) weakness, (-) changes in appetite or weight. (-) allergies, (-) not immunocompromised. Head: (-) headaches, (-) head injury or deformity. Eyes: (-) visual changes, (-) eye pain, (-) eye discharges, (-) redness, (-) itching, (-) excessive tearing, (-) double or blurred vision, (-) glaucoma, (-) cataracts. Nose and Sinuses: (-) frequent colds, (-) nasal stuffiness or itchiness, (-) postnasal drip, (-) hay fever, (-) nosebleeds, (-) sinus trouble. Mouth and Throat: (-) bleeding gums, (-) toothache, (-) odd taste sensations, (-) sores on tongue, (-) frequent sore throat, (-) hoarseness. Neck: (-) swollen glands, (-) enlarged thyroid, (-) neck pain. Cardiac: (-) chest pain, (-) edema, (-) high blood pressure, (-) irregular heartbeat, (-) orthopnea, (-) palpitations, (-) paroxysmal nocturnal dyspnea, (-) shortness of breath. Respiratory: (-) cough, (-) hemoptysis, (-) shortness of breath, (-) cyanosis, (-) wheezing, (-) nocturnal choking or gasping, (-) TB exposure. Breasts: (-) nipple discharge, (-) breast lumps, (-) breast pain. Gastrointestinal: (-) abdominal pain, (-) heartburn, (-) constipation, (-) diarrhea, (-) nausea, (-) vomiting, (-) hematochezia, (-) melena, (-) change in bowel habits. Urinary: (-) dysuria, (-) frequency, (-) urgency, (-) hesitancy, (-) polyuria, (-) nocturia, (-) hematuria, (-) urinary incontinence, (-) flank pain, (-) change in urinary  habits. Gynecologic/Urologic: (-) genital sores or lesions, (-) history of STD, (-) sexual difficulties. Musculoskeletal: (-) muscle pain, (-) joint pain, (-) bone pain. Peripheral Vascular: (-) intermittent claudication, (-) cramps, (-) varicose veins, (-) thrombophlebitis. Neurological: (-) numbness, (-) tingling, (-) tremors, (-) seizures, (-) vertigo, (-) dizziness, (-) memory loss, (-) any focal or diffuse neurological deficits. Psychiatric: (-) anxiety, (-) depression, (-) sleep disturbance, (-) irritability, (-) mood swings, (-) suicidal thoughts or ideations. Endocrine: (-) heat or cold intolerance, (-) excessive sweating, (-) diabetes, (-) excessive thirst, (-) excessive hunger, (-) excessive urination, (-) hirsutism, (-) change in ring or shoe size. Hematologic/Lymphatic: (-) anemia, (-) easy bruising, (-) excessive bleeding, (-) history of blood transfusions. Skin: (-) rashes, (-) lumps, (-) itching, (-) dryness, (-) acne, (-) discoloration, (-) recurrent skin infections, (-) changes in hair, nails or moles.  Vital Signs: Weight:   56.71 kgs Height:   '5\' 2"'$  BMI:   22.93 BSA:   1.58 BP:   137/82(Right Arm)(Sitting)  Examination: Prior to the examination, I have reviewed: (1) the patient's current medications and allergies, (  2) medical, family, and social histories, (3) review of systems, and (4) vital signs.  General Appearance - Adult: The patient is a well-developed, well-nourished, female, has no recognizable syndromes or patterns of malformation, and is in no acute distress. She is awake, alert, coherent, spontaneous, and logical. She is oriented to time, place, and person and communicates without difficulty.  Head: The patient's head was normocephalic and without any evidence of trauma or lesions.  Face: Her facial motion was intact and symmetric bilaterally with normal resting facial tone and voluntary facial power.  Skin: Gross inspection of her facial skin demonstrated no  evidence of abnormality.  Eyes: Her pupils are equal, regular, reactive to light and accommodate (PERRLA). Extraocular movements were intact (EOMI). Conjunctivae were normal. There was no sclera icterus. There was no nystagmus. Eyelids appeared normal. There was no ptosis, lid lag, lid edema, or lagophthalmos.  External ears: Both of her external ears were normal in size, shape, angulation, and location.  External auditory canals: Examination of the external auditory canals revealed The left external canal was stable.  Mastoid Cavity - Right: Modified radical. Meatoplasty: Patent. Cavity Cleaning: Procedure: Mastoidectomy Cavity Cleaning Using the dissecting microscope and micro-instruments, the cavity was cleaned and debrided. The right mastoid cavity was stable and well epithelialized.  Right Tympanic Membrane: Retracted.  Left Tympanic Membrane: The left tympanic membrane was clear and mobile. There was an air containing left middle ear space.  Audiology Procedures: Audiogram/Graph Audiogram: I have referred the patient for audiometric testing. I have reviewed the patient's audiogram. (EMK).  Procedure:  Patient was placed in a special soundproof testing booth with earphones. Sounds were passed through the earphones. Each ear was tested separately. The patient was asked which part of the ear sound was heard. Earphones were removed, bone conduction hearing was then tested by having a vibrating instrument held close against the patient's ear. The patient was found to have a maximum conductive hearing loss AD with SRTs 50 DB and 100% discrimination. Should a moderate high-frequency sensor hearing loss in the left ear with an SRT of 10 DB and 100% discrimination.  Impression: Other:  1. Stable right radical mastoid cavity. Well epithelialized. No granulation tissue. 2. The patient is doing well enough that she does not require a mastoid obliteration procedure. 3. Proceed with a right Baha  hearing implant, one hour, [facility] Operating room, general anesthesia, as an outpatient. Risks, complications, and alternatives of the procedure were explained to her. Questions were invited and answered. Informed consent was signed and witnessed. Preoperative teaching and counseling were provided. The procedure is scheduled for January 16, 2017.  Plan: Clinical summary letter made available to patient today. This letter may not be complete at time of service. Please contact our office within 3 days for a completed summary of today's visit.  Status: stable. Medications: None required.  Diet: no restriction. Procedure: BAHA hearing implant Right ear. Duration:  1 hour. Surgeon: Fannie Knee MD Office Phone: [phone] Office Fax: [phone] Cell Phone: [phone]. Anesthesia Required: [place]: no. Latex Allergy: no.  Informed consent: Informed consent was provided in a quiet examination room and was witnessed. Risks, complications, and alternatives (such as doing nothing, using a CROS system, or using an alternative hearing implant approved for the treatment of their condition) of osseo-integrated hearing implants were explained to the patient and included, but were not limited to: infection, bleeding, reaction to anesthesia, hypertrophic scar formation, need for additional procedures or revisions,  failure and/extrusion of prosthesis(es), ear or scalp numbness, failure to improve hearing, other unforeseen or unpredictable complications, anesthestic neurotoxicity, death, etc. Questions were invited and answered. Preoperative teaching and counseling were provided. Informed consent - status: Informed consent was provided and was signed and witnessed.  Recommendations: Right temporal bone. Consider a hearing aid AD or an osseointegrated hearing implant AD such as a Public librarian. Follow-Up: Postoperative visit as  scheduled.  Diagnosis: H90.11  Conductive hearing loss, unilateral, right ear, with unrestricted hearing on the contralateral side  H95.191  Other disorders following mastoidectomy, R ear   Careplan: (1) a Hypertension/Hypotension - HNS16 Blood Pressure: High Blood Pressure (Hypertension) or Low Blood Pressure ( Hypotension)   Follow up plan given.  We have noticed an issue with your blood pressure.  We recommend that you contact your family physician. (2) Hearing Loss (3) Postop BAHA/Ponto hearing implants  Followup: Postop visit   This visit note has been electronically signed off by Vicie Mutters, MD, MS, FACS on 01/07/2017 at 08:57 PM.       Next Appointment: 01/16/2017 at 09:00 AM     Past Medical History:  Diagnosis Date  . Breast cancer ([place]) 2016   left br  . Cancer [facility])    left breast cancer  . Ear problems 56 yr old   chronic ear problems on right- ear surgery when 8 yr- Dr Cresenciano Lick (ENT)- permanent hearing loss on the right  . Hypertension     Past Surgical History:  Procedure Laterality Date  . BIOPSY BREAST  08/10/14   Left  . BREAST CYST EXCISION     age 89, pilonidal cyst  . BREAST LUMPECTOMY Left 2016   at age 79  . BREAST LUMPECTOMY WITH NEEDLE LOCALIZATION AND AXILLARY SENTINEL LYMPH NODE BX Left 09/23/2014   Procedure: LEFT BREAST LUMPECTOMY WITH BRACKETED  NEEDLE LOCALIZATION AND LEFT AXILLARY SENTINEL LYMPH NODE BX;  Surgeon: Alphonsa Overall, MD;  Location: [facility] [place];  Service: General;  Laterality: Left;  . cyst removed     at age 26  . MASTOIDECTOMY  1970  . right knee scope for torn meniscus  2/10  . WISDOM TOOTH EXTRACTION      Family History  Problem Relation Age of Onset  . Hypertension Mother   . Hypertension Father   . Stroke Father   . Prostate cancer Father   . Sudden death Neg Hx   . Hyperlipidemia Neg Hx   . Heart attack Neg Hx   . Diabetes Neg Hx    Social History:  reports that she quit smoking about 29 years  ago. Her smoking use included cigarettes. She smoked 1.00 pack per day. she has never used smokeless tobacco. She reports that she does not drink alcohol or use drugs.  Allergies:  Allergies  Allergen Reactions  . Penicillins Hives, Itching and Other (See Comments)    Has patient had a PCN reaction causing immediate rash, facial/tongue/throat swelling, SOB or lightheadedness with hypotension: No Has patient had a PCN reaction causing severe rash involving mucus membranes or skin necrosis: No Has patient had a PCN reaction that required hospitalization: Yes Has patient had a PCN reaction occurring within the last 10 years: No If all of the above answers are "NO", then may proceed with Cephalosporin use.     No medications prior to admission.    Results for orders placed or performed during the hospital encounter of 01/09/17 (from the past 48 hour(s))  CBC  Status: None   Collection Time: 01/09/17 11:00 AM  Result Value Ref Range   WBC 4.1 4.0 - 10.5 K/uL   RBC 4.69 3.87 - 5.11 MIL/uL   Hemoglobin 13.8 12.0 - 15.0 g/dL   HCT 42.5 36.0 - 46.0 %   MCV 90.6 78.0 - 100.0 fL   MCH 29.4 26.0 - 34.0 pg   MCHC 32.5 30.0 - 36.0 g/dL   RDW 12.8 11.5 - 15.5 %   Platelets 224 150 - 400 K/uL  Basic metabolic panel     Status: None   Collection Time: 01/09/17 11:00 AM  Result Value Ref Range   Sodium 138 135 - 145 mmol/L   Potassium 4.0 3.5 - 5.1 mmol/L   Chloride 105 101 - 111 mmol/L   CO2 24 22 - 32 mmol/L   Glucose, Bld 96 65 - 99 mg/dL   BUN 9 6 - 20 mg/dL   Creatinine, Ser 0.65 0.44 - 1.00 mg/dL   Calcium 9.2 8.9 - 10.3 mg/dL   GFR calc non Af Amer >60 >60 mL/min   GFR calc Af Amer >60 >60 mL/min    Comment: (NOTE) The eGFR has been calculated using the CKD EPI equation. This calculation has not been validated in all clinical situations. eGFR's persistently <60 mL/min signify possible Chronic Kidney Disease.    Anion gap 9 5 - 15   No results found.  Review of Systems   Constitutional: Negative.   HENT: Positive for hearing loss.   Eyes: Negative.   Respiratory: Negative.   Cardiovascular: Negative.   Gastrointestinal: Negative.   Genitourinary: Negative.   Musculoskeletal: Negative.   Skin: Negative.   Endo/Heme/Allergies: Negative.     Last menstrual period 08/31/2011. Physical Exam   Assessment/Plan 1. Right maximum conductive hearing loss secondary to right radical  2. The patient has been counseled that she would benefit from a Right BAHA hearing implant, BIA400, 1 hr. [facility] OR, general anesthesia, as an outpatient. Risks, complications and alternatives of the procedure have been explained to the patient. Questions have been invited and answered. Informed consent has been signed and witnessed. Preoperative teaching and counseling have been provided. She has trialed a right BAHA5 headband. 3. The operation is scheduled for Thursday, January 16, 2017, [facility] [place], at 9:00am.  Vicie Mutters, MD 01/10/2017, 3:56 PM

## 2017-01-16 ENCOUNTER — Ambulatory Visit (HOSPITAL_COMMUNITY): Payer: 59 | Admitting: Anesthesiology

## 2017-01-16 ENCOUNTER — Ambulatory Visit (HOSPITAL_COMMUNITY)
Admission: RE | Admit: 2017-01-16 | Discharge: 2017-01-16 | Disposition: A | Discharge status: Home / Self Care | Payer: 59 | Source: Ambulatory Visit | Attending: Otolaryngology | Admitting: Otolaryngology

## 2017-01-16 ENCOUNTER — Encounter (HOSPITAL_COMMUNITY)
Admission: RE | Disposition: A | Discharge status: Home / Self Care | Payer: Self-pay | Source: Ambulatory Visit | Attending: Otolaryngology

## 2017-01-16 DIAGNOSIS — H90A31 Mixed conductive and sensorineural hearing loss, unilateral, right ear with restricted hearing on the contralateral side: Secondary | ICD-10-CM | POA: Insufficient documentation

## 2017-01-16 DIAGNOSIS — Z880 Allergy status to penicillin: Secondary | ICD-10-CM | POA: Diagnosis not present

## 2017-01-16 DIAGNOSIS — H9191 Unspecified hearing loss, right ear: Secondary | ICD-10-CM | POA: Diagnosis not present

## 2017-01-16 DIAGNOSIS — I10 Essential (primary) hypertension: Secondary | ICD-10-CM | POA: Diagnosis not present

## 2017-01-16 DIAGNOSIS — H95111 Chronic inflammation of postmastoidectomy cavity, right ear: Secondary | ICD-10-CM | POA: Diagnosis not present

## 2017-01-16 DIAGNOSIS — H9011 Conductive hearing loss, unilateral, right ear, with unrestricted hearing on the contralateral side: Secondary | ICD-10-CM | POA: Diagnosis not present

## 2017-01-16 DIAGNOSIS — H95191 Other disorders following mastoidectomy, right ear: Secondary | ICD-10-CM | POA: Diagnosis not present

## 2017-01-16 DIAGNOSIS — Z87891 Personal history of nicotine dependence: Secondary | ICD-10-CM | POA: Insufficient documentation

## 2017-01-16 DIAGNOSIS — C50512 Malignant neoplasm of lower-outer quadrant of left female breast: Secondary | ICD-10-CM | POA: Diagnosis not present

## 2017-01-16 DIAGNOSIS — Z853 Personal history of malignant neoplasm of breast: Secondary | ICD-10-CM | POA: Insufficient documentation

## 2017-01-16 HISTORY — PX: BONE ANCHORED HEARING AID IMPLANT: SHX5193

## 2017-01-16 SURGERY — INSERTION, BONE ANCHORED HEARING AID
Anesthesia: General | Site: Ear | Laterality: Right

## 2017-01-16 MED ORDER — CLINDAMYCIN PHOSPHATE 900 MG/50ML IV SOLN
INTRAVENOUS | Status: AC
  Filled 2017-01-16: qty 50

## 2017-01-16 MED ORDER — SUGAMMADEX SODIUM 200 MG/2ML IV SOLN
INTRAVENOUS | Status: DC | PRN
Start: 2017-01-16 — End: 2017-01-16
  Administered 2017-01-16: 120 mg via INTRAVENOUS

## 2017-01-16 MED ORDER — ONDANSETRON HCL 4 MG/2ML IJ SOLN
INTRAMUSCULAR | Status: AC
  Filled 2017-01-16: qty 2

## 2017-01-16 MED ORDER — PROPOFOL 10 MG/ML IV BOLUS
INTRAVENOUS | Status: AC
  Filled 2017-01-16: qty 20

## 2017-01-16 MED ORDER — LACTATED RINGERS IV SOLN
INTRAVENOUS | Status: DC
  Administered 2017-01-16 (×2): via INTRAVENOUS

## 2017-01-16 MED ORDER — BACITRACIN ZINC 500 UNIT/GM EX OINT
TOPICAL_OINTMENT | CUTANEOUS | Status: AC
  Filled 2017-01-16: qty 28.35

## 2017-01-16 MED ORDER — PHENYLEPHRINE HCL 10 MG/ML IJ SOLN
INTRAMUSCULAR | Status: DC | PRN
  Administered 2017-01-16: 20 ug/min via INTRAVENOUS

## 2017-01-16 MED ORDER — LIDOCAINE-EPINEPHRINE 1 %-1:100000 IJ SOLN
INTRAMUSCULAR | Status: DC | PRN
  Administered 2017-01-16: 20 mL

## 2017-01-16 MED ORDER — ONDANSETRON HCL 4 MG/2ML IJ SOLN
4.0000 mg | INTRAMUSCULAR | Status: AC
  Administered 2017-01-16: 4 mg via INTRAVENOUS

## 2017-01-16 MED ORDER — CLINDAMYCIN PHOSPHATE 900 MG/50ML IV SOLN
900.0000 mg | INTRAVENOUS | Status: AC
  Administered 2017-01-16: 900 mg via INTRAVENOUS

## 2017-01-16 MED ORDER — DEXAMETHASONE SODIUM PHOSPHATE 10 MG/ML IJ SOLN
INTRAMUSCULAR | Status: AC
  Filled 2017-01-16: qty 1

## 2017-01-16 MED ORDER — METHYLENE BLUE 0.5 % INJ SOLN
INTRAVENOUS | Status: DC | PRN
Start: 2017-01-16 — End: 2017-01-16
  Administered 2017-01-16: 10 mL via SUBMUCOSAL

## 2017-01-16 MED ORDER — PROPOFOL 10 MG/ML IV BOLUS
INTRAVENOUS | Status: DC | PRN
  Administered 2017-01-16: 120 mg via INTRAVENOUS

## 2017-01-16 MED ORDER — BACITRACIN ZINC 500 UNIT/GM EX OINT
TOPICAL_OINTMENT | CUTANEOUS | Status: DC | PRN
  Administered 2017-01-16: 1 via TOPICAL

## 2017-01-16 MED ORDER — MIDAZOLAM HCL 5 MG/5ML IJ SOLN
INTRAMUSCULAR | Status: DC | PRN
  Administered 2017-01-16 (×2): 1 mg via INTRAVENOUS

## 2017-01-16 MED ORDER — ROCURONIUM BROMIDE 100 MG/10ML IV SOLN
INTRAVENOUS | Status: DC | PRN
  Administered 2017-01-16: 50 mg via INTRAVENOUS

## 2017-01-16 MED ORDER — LIDOCAINE-EPINEPHRINE 1 %-1:100000 IJ SOLN
INTRAMUSCULAR | Status: AC
  Filled 2017-01-16: qty 1

## 2017-01-16 MED ORDER — PHENYLEPHRINE 40 MCG/ML (10ML) SYRINGE FOR IV PUSH (FOR BLOOD PRESSURE SUPPORT)
PREFILLED_SYRINGE | INTRAVENOUS | Status: AC
  Filled 2017-01-16: qty 10

## 2017-01-16 MED ORDER — PHENYLEPHRINE HCL 10 MG/ML IJ SOLN
INTRAMUSCULAR | Status: DC | PRN
  Administered 2017-01-16: 120 ug via INTRAVENOUS
  Administered 2017-01-16 (×2): 80 ug via INTRAVENOUS

## 2017-01-16 MED ORDER — 0.9 % SODIUM CHLORIDE (POUR BTL) OPTIME
TOPICAL | Status: DC | PRN
  Administered 2017-01-16: 1000 mL

## 2017-01-16 MED ORDER — LIDOCAINE HCL (CARDIAC) 20 MG/ML IV SOLN
INTRAVENOUS | Status: DC | PRN
  Administered 2017-01-16: 100 mg via INTRAVENOUS

## 2017-01-16 MED ORDER — LIDOCAINE 2% (20 MG/ML) 5 ML SYRINGE
INTRAMUSCULAR | Status: AC
  Filled 2017-01-16: qty 5

## 2017-01-16 MED ORDER — MIDAZOLAM HCL 2 MG/2ML IJ SOLN
INTRAMUSCULAR | Status: AC
  Filled 2017-01-16: qty 2

## 2017-01-16 MED ORDER — POLYMYXIN B SULFATE 500000 UNITS IJ SOLR
INTRAMUSCULAR | Status: DC | PRN
  Administered 2017-01-16: 500 mL

## 2017-01-16 MED ORDER — METHYLENE BLUE 0.5 % INJ SOLN
INTRAVENOUS | Status: AC
  Filled 2017-01-16: qty 10

## 2017-01-16 MED ORDER — FENTANYL CITRATE (PF) 250 MCG/5ML IJ SOLN
INTRAMUSCULAR | Status: AC
  Filled 2017-01-16: qty 5

## 2017-01-16 MED ORDER — FENTANYL CITRATE (PF) 100 MCG/2ML IJ SOLN
INTRAMUSCULAR | Status: DC | PRN
  Administered 2017-01-16 (×3): 50 ug via INTRAVENOUS

## 2017-01-16 MED ORDER — DEXAMETHASONE SODIUM PHOSPHATE 10 MG/ML IJ SOLN
10.0000 mg | INTRAMUSCULAR | Status: AC
  Administered 2017-01-16: 10 mg via INTRAVENOUS

## 2017-01-16 SURGICAL SUPPLY — 70 items
BAG DECANTER FOR FLEXI CONT (MISCELLANEOUS) ×2 IMPLANT
BIT DRILL WIDENING 4MM (DRILL) ×1 IMPLANT
BLADE CLIPPER SURG (BLADE) ×2 IMPLANT
BLADE SURG 15 STRL LF DISP TIS (BLADE) IMPLANT
BLADE SURG 15 STRL SS (BLADE)
CANISTER SUCT 3000ML PPV (MISCELLANEOUS) ×2 IMPLANT
CAP HEALING (MISCELLANEOUS) IMPLANT
CAP HEALING W/PLUG 030MM (MISCELLANEOUS) ×2 IMPLANT
CORD BIPOLAR FORCEPS 12FT (ELECTRODE) ×2 IMPLANT
COTTONBALL LRG STERILE PKG (GAUZE/BANDAGES/DRESSINGS) IMPLANT
COVER SURGICAL LIGHT HANDLE (MISCELLANEOUS) ×2 IMPLANT
DRAPE INCISE 23X17 IOBAN STRL (DRAPES) ×1
DRAPE INCISE IOBAN 23X17 STRL (DRAPES) ×1 IMPLANT
DRAPE INCISE IOBAN 66X45 STRL (DRAPES) ×2 IMPLANT
DRAPE SURG 17X11 SM STRL (DRAPES) ×2 IMPLANT
DRESSING ALLEVYN 5X5 FM NADH (MISCELLANEOUS) IMPLANT
DRILL WIDENING 4MM (DRILL) ×2
DRSG ALLEVYN 5X5 FM NADH (MISCELLANEOUS)
DRSG EMULSION OIL 3X3 NADH (GAUZE/BANDAGES/DRESSINGS) IMPLANT
DRSG GLASSCOCK MASTOID ADT (GAUZE/BANDAGES/DRESSINGS) ×2 IMPLANT
DRSG TELFA 3X8 NADH (GAUZE/BANDAGES/DRESSINGS) IMPLANT
ELECT COATED BLADE 2.86 ST (ELECTRODE) ×2 IMPLANT
ELECT REM PT RETURN 9FT ADLT (ELECTROSURGICAL) ×2
ELECTRODE REM PT RTRN 9FT ADLT (ELECTROSURGICAL) ×1 IMPLANT
FILTER STRAW FLUID ASPIR (MISCELLANEOUS) ×2 IMPLANT
GAUZE PACKING IODOFORM 1/4X15 (GAUZE/BANDAGES/DRESSINGS) ×2 IMPLANT
GAUZE PACKING IODOFORM 1/4X5 (PACKING) IMPLANT
GAUZE SPONGE 4X4 16PLY XRAY LF (GAUZE/BANDAGES/DRESSINGS) IMPLANT
GLOVE BIOGEL PI IND STRL 6.5 (GLOVE) ×2 IMPLANT
GLOVE BIOGEL PI IND STRL 8 (GLOVE) ×1 IMPLANT
GLOVE BIOGEL PI INDICATOR 6.5 (GLOVE) ×2
GLOVE BIOGEL PI INDICATOR 8 (GLOVE) ×1
GLOVE ECLIPSE 7.5 STRL STRAW (GLOVE) ×2 IMPLANT
GLOVE SURG SS PI 6.0 STRL IVOR (GLOVE) ×2 IMPLANT
GLOVE SURG SS PI 6.5 STRL IVOR (GLOVE) ×4 IMPLANT
GOWN STRL REUS W/ TWL LRG LVL3 (GOWN DISPOSABLE) ×4 IMPLANT
GOWN STRL REUS W/TWL LRG LVL3 (GOWN DISPOSABLE) ×4
GUIDE DRILL EAR BAHA 3MM 4MM (MISCELLANEOUS) ×2 IMPLANT
IMPL EAR ABUTMENT 4M W/12M (Prosthesis and Implant ENT) ×1 IMPLANT
IMPLANT EAR ABUTMENT 4M W/12M (Prosthesis and Implant ENT) ×2 IMPLANT
KIT BAHA BLADE GUIDE DRILL (KITS) IMPLANT
KIT BASIN OR (CUSTOM PROCEDURE TRAY) ×2 IMPLANT
KIT ROOM TURNOVER OR (KITS) ×2 IMPLANT
MARKER SKIN DUAL TIP RULER LAB (MISCELLANEOUS) ×2 IMPLANT
NEEDLE 18GX1X1/2 (RX/OR ONLY) (NEEDLE) IMPLANT
NEEDLE HYPO 25X1 1.5 SAFETY (NEEDLE) ×2 IMPLANT
NS IRRIG 1000ML POUR BTL (IV SOLUTION) ×2 IMPLANT
PAD ARMBOARD 7.5X6 YLW CONV (MISCELLANEOUS) ×2 IMPLANT
PENCIL BUTTON HOLSTER BLD 10FT (ELECTRODE) ×2 IMPLANT
PROCESSOR BAHA 5 SILVER (OTOLOGIC RELATED) ×2 IMPLANT
PUNCH BIOPSY 4MM COCHLEAR (MISCELLANEOUS) IMPLANT
SCRUB BETADINE 4OZ XXX (MISCELLANEOUS) ×2 IMPLANT
SPONGE SURGIFOAM ABS GEL 12-7 (HEMOSTASIS) IMPLANT
SUT BONE WAX W31G (SUTURE) IMPLANT
SUT CHROMIC 3 0 PS 2 (SUTURE) ×2 IMPLANT
SUT CHROMIC 4 0 PS 2 18 (SUTURE) IMPLANT
SUT ETHILON 3 0 PS 1 (SUTURE) ×2 IMPLANT
SUT ETHILON 4 0 PS 2 18 (SUTURE) ×4 IMPLANT
SUT MNCRL AB 4-0 PS2 18 (SUTURE) IMPLANT
SUT VIC AB 3-0 PS2 18 (SUTURE) ×1
SUT VIC AB 3-0 PS2 18XBRD (SUTURE) ×1 IMPLANT
SUT VIC AB 4-0 P-3 18X BRD (SUTURE) IMPLANT
SUT VIC AB 4-0 P3 18 (SUTURE)
SYR BULB 3OZ (MISCELLANEOUS) ×2 IMPLANT
SYR TB 1ML LUER SLIP (SYRINGE) ×2 IMPLANT
TOWEL OR 17X24 6PK STRL BLUE (TOWEL DISPOSABLE) IMPLANT
TOWEL OR 17X26 10 PK STRL BLUE (TOWEL DISPOSABLE) IMPLANT
TRAY ENT MC OR (CUSTOM PROCEDURE TRAY) ×2 IMPLANT
WATER STERILE IRR 1000ML POUR (IV SOLUTION) IMPLANT
WIPE INSTRUMENT VISIWIPE 73X73 (MISCELLANEOUS) ×2 IMPLANT

## 2017-01-16 NOTE — Transfer of Care (Signed)
Immediate Anesthesia Transfer of Care Note  Patient: Catherine Monroe  Procedure(s) Performed: RIGHT BONE ANCHORED HEARING AID (BAHA) IMPLANT (Right )  Patient Location: PACU  Anesthesia Type:General  Level of Consciousness: awake, alert , oriented and patient cooperative  Airway & Oxygen Therapy: Patient Spontanous Breathing  Post-op Assessment: Report given to RN, Post -op Vital signs reviewed and stable and Patient moving all extremities  Post vital signs: Reviewed and stable  Last Vitals:  Vitals:   01/16/17 1012  BP: (!) 164/72  Pulse: (!) 55  Resp: 18  Temp: 36.6 C  SpO2: 100%    Last Pain:  Vitals:   01/16/17 1012  TempSrc: Oral         Complications: No apparent anesthesia complications

## 2017-01-16 NOTE — Brief Op Note (Signed)
01/16/2017  12:11 PM  PATIENT:  Catherine Monroe  56 y.o. 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 Earl Gala)  PROCEDURE:  Procedure(s): RIGHT BONE ANCHORED HEARING AID (BAHA) IMPLANT (Right) - BIA400, 4x2mm  SURGEON:  Surgeon(s) and Role:    Vicie Mutters, MD - Primary  PHYSICIAN ASSISTANT:   ASSISTANTS: none   ANESTHESIA:   general  EBL:  < 10 ml  BLOOD ADMINISTERED:none  DRAINS: none   LOCAL MEDICATIONS USED:  NONE  SPECIMEN:  No Specimen  DISPOSITION OF SPECIMEN:  N/A  COUNTS:  YES  TOURNIQUET:  * No tourniquets in log *  DICTATION: .Other Dictation: Dictation Number [id number]  PLAN OF CARE: Discharge to home after PACU  PATIENT DISPOSITION:  PACU - hemodynamically stable.   Delay start of Pharmacological VTE agent (>24hrs) due to surgical blood loss or risk of bleeding: not applicable

## 2017-01-16 NOTE — Anesthesia Postprocedure Evaluation (Signed)
Anesthesia Post Note  Patient: Catherine Monroe  Procedure(s) Performed: RIGHT BONE ANCHORED HEARING AID (BAHA) IMPLANT (Right Ear)     Patient location during evaluation: PACU Anesthesia Type: General Level of consciousness: awake and alert Pain management: pain level controlled Vital Signs Assessment: post-procedure vital signs reviewed and stable Respiratory status: spontaneous breathing, nonlabored ventilation, respiratory function stable and patient connected to nasal cannula oxygen Cardiovascular status: blood pressure returned to baseline and stable Postop Assessment: no apparent nausea or vomiting Anesthetic complications: no    Last Vitals:  Vitals:   01/16/17 1237 01/16/17 1305  BP: 127/87 119/78  Pulse: 73 72  Resp: 14 16  Temp: 36.6 C 36.7 C  SpO2: 100% 100%    Last Pain:  Vitals:   01/16/17 1305  TempSrc:   PainSc: 0-No pain                 Blayton Huttner DAVID

## 2017-01-16 NOTE — Anesthesia Preprocedure Evaluation (Signed)
Anesthesia Evaluation  Patient identified by MRN, date of birth, ID band Patient awake    Reviewed: Allergy & Precautions, NPO status , Patient's Chart, lab work & pertinent test results  Airway Mallampati: I  TM Distance: >3 FB Neck ROM: Full    Dental   Pulmonary former smoker,    Pulmonary exam normal        Cardiovascular hypertension, Pt. on medications Normal cardiovascular exam     Neuro/Psych    GI/Hepatic   Endo/Other    Renal/GU      Musculoskeletal   Abdominal   Peds  Hematology   Anesthesia Other Findings   Reproductive/Obstetrics                             Anesthesia Physical Anesthesia Plan  ASA: II  Anesthesia Plan: General   Post-op Pain Management:    Induction: Intravenous  PONV Risk Score and Plan: 3 and Ondansetron, Dexamethasone and Midazolam  Airway Management Planned: Oral ETT  Additional Equipment:   Intra-op Plan:   Post-operative Plan: Extubation in OR  Informed Consent: I have reviewed the patients History and Physical, chart, labs and discussed the procedure including the risks, benefits and alternatives for the proposed anesthesia with the patient or authorized representative who has indicated his/her understanding and acceptance.     Plan Discussed with: CRNA and Surgeon  Anesthesia Plan Comments:         Anesthesia Quick Evaluation

## 2017-01-16 NOTE — Interval H&P Note (Signed)
History and Physical Interval Note:  01/16/2017 10:11 AM  Catherine Monroe  has presented today for surgery, with the diagnosis of right moderately severe mixed hearing loss s/p previous mastoidectomy.  The various methods of treatment have been discussed with the patient.  After consideration of risks, benefits and other options for treatment, the patient has consented to  Procedure(s): RIGHT BONE ANCHORED HEARING AID (BAHA) IMPLANT (Right) as a surgical intervention .  The patient's history has been reviewed, patient examined, no change in status, stable for surgery.  I have reviewed the patient's chart and labs.  Questions were answered to the patient's satisfaction.  The patient does not report any fever, upper/lower respiratory tract infection, or cough during the last three days.   Vicie Mutters

## 2017-01-16 NOTE — Discharge Instructions (Signed)
1. Discharge patient today, with son, once VS are stable, fully awake, "street ready", and ok'ed by an anesthesiologist. 2. Return to Dr. Thornell Mule' office on 01-23-17 at 3:45pm for follow-up and suture removal.  3. Soft diet today, regular diet tomorrow. 4. Quiet indoor activity today. Advance to regular activity tomorrow. Do not operate a motor vehicle today or make any major decisions. 5. Remove right Glasscock mastoid dressing Friday morning, 01-17-17. Leave white healing cap in place. 6. Keep head elevated on 2 pillows for 3 evenings. Avoid water exposure. Shower with a shower cap. Wash hair beauty parlor style. 7. Please call [phone] for any questions or problems related to your procedure. 8. Postop medications include:      A. Clindamycinl 300 mg three x per day for 10 days      B. Norco 5/325 1 tab every 4-6 hours as needed for pain for 3 days (#10 tabs)     C. Mupirocin ointment - apply around implant twice per day after you see Dr. Thornell Mule on 01-23-2017.

## 2017-01-16 NOTE — Anesthesia Procedure Notes (Signed)
Procedure Name: Intubation Date/Time: 01/16/2017 11:07 AM Performed by: Kerby Less, CRNA Pre-anesthesia Checklist: Patient identified, Emergency Drugs available, Suction available and Patient being monitored Patient Re-evaluated:Patient Re-evaluated prior to induction Oxygen Delivery Method: Circle System Utilized Preoxygenation: Pre-oxygenation with 100% oxygen Induction Type: IV induction Ventilation: Mask ventilation without difficulty Laryngoscope Size: Mac and 3 Grade View: Grade I Tube type: Oral Number of attempts: 1 Airway Equipment and Method: Stylet Placement Confirmation: ETT inserted through vocal cords under direct vision,  positive ETCO2 and breath sounds checked- equal and bilateral Secured at: 20 cm Tube secured with: Tape Dental Injury: Teeth and Oropharynx as per pre-operative assessment

## 2017-01-17 ENCOUNTER — Encounter (HOSPITAL_COMMUNITY): Payer: Self-pay | Admitting: Otolaryngology

## 2017-01-17 NOTE — Op Note (Signed)
NAME:  Catherine Monroe NO.:  MEDICAL RECORD NO.:  [id number]  LOCATION:                                 FACILITY:  PHYSICIAN:  Fannie Knee, M.D.         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  She had undergone a right modified radical mastoidectomy many years ago by Dr. Earl Gala that resulted in a significant mixed hearing loss AD. 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 She was trialed with a BAHA5 headband and liked the sound quality.  She was recommended for a right BAHA hearing implant, 45 minutes, general ET anesthesia, [facility] OR, as an outpatient.  Risks, complications, and alternatives of the procedure were explained to her. Questions were invited and answered.  Informed consent was signed and Witnessed. Preoperative teaching and counseling were provided.   Preoperative audiometric testing on January 07, 2017, documented moderate to moderately severe mixed hearing loss in the right ear with an SRT of 50 dB and 100% discrimination at 75 dB HDL.  She had a moderate high-frequency hearing loss in the left ear with an SRT of 10 dB and 100% discrimination at 40 dB HDL.  JUSTIFICATION FOR OUTPATIENT SETTING:  Patient's age and need for general endotracheal anesthesia.  JUSTIFICATION FOR OVERNIGHT STAY:  Not applicable.  PREOPERATIVE DIAGNOSIS:  Moderate to moderately severe mixed hearing loss of the right ear following a modified radical mastoidectomy many years Ago by Dr. Earl Gala.  POSTOPERATIVE DIAGNOSIS:  Moderate to moderately severe mixed hearing loss of right ear following a modified radical  mastoidectomy many years Ago by Dr. Earl Gala.  OPERATION:  Right BAHA hearing implant, BIA400, 4 x 12 mm.  SURGEON:  Fannie Knee, MD.  ANESTHESIA:  General endotracheal, Dr. Lillia Abed; CRNA, [place].  COMPLICATIONS:  None.  DISCHARGE STATUS:  Stable.  SUMMARY OF REPORT:  After the patient was taken to the operating room #8 at [place], she was placed in supine position.  An IV had been begun in the holding area.  A general IV induction was then performed, and the patient was orally intubated without difficulty.  Eyelids were taped shut.  She was properly positioned and monitored.  Elbows and ankles were padded with foam rubber, and I initiated a time-out at 11:09 a.m.  The patient was then turned 90 degrees counterclockwise.  A small amount of hair was clipped in the right postauricular area.  Hair was taped, and stockinette cap was applied.  The site was marked for [facility] implant using a BAHA template.  Her right ear and hemiface were then prepped with Betadine and draped in a standard fashion for a right BAHA implant. A second time out was performed.  Methylene blue dye was then used to  tattoo through the skin to the bone to mark the BAHA site.  A 5-mm skin punch was then used to remove a disk of skin.  5-mm incisions were made at 6 and 12 o'clock.  Bleeding was controlled with bipolar cautery.  A cruciate incision was made in the Periosteum, and 4 subperiosteal flaps were elevated.  The bone was remarked with a marking pen.  A pilot hole was drilled to a 3-mm depth using continuous suction irrigation and a BAHA drill at 2000 rpm.  The pilot hole was deepened to 4 mm.  There was solid bone deep to the pilot hole. A 4-mm wide countersink hole was then drilled with suction irrigation. Measurement was taken from the skull to the skin and measured 11 mm. Therefore, a 12 mm x 4 mm BIA400 implant with abutment was opened.  The implant was then implanted using  the BAHA drill at 50 Newton cm of torque. The implant was then hand tightened one eight of a turn.  The site was copiously irrigated with bacitracin and polymyxin irrigation.  Four sutures were then placed, 2 purse-strings and 2 interrupted 4-0 Ethilon sutures.  Bacitracin ointment was applied around the base of the abutment. A healing cap was applied.  A quarter-inch iodoform Nu-Gauze impregnated with bacitracin ointment was then wound around the base of the abutment medial to the healing cap. An adult Glasscock mastoid dressing was applied loosely in the standard fashion.   The patient was then awakened, extubated, and transferred to her hospital bed. She appeared to tolerate the general endotracheal anesthesia and the procedure well. She left the operating room in stable condition.  TOTAL FLUIDS:  900 mL.  TOTAL ESTIMATED BLOOD LOSS:  Less than 10 mL.  COUNTS:  Sponge, needle, and cotton ball counts were correct at the termination of the procedure.  SPECIMENS:  No specimens were sent to Pathology or Microbiology.  The patient will be admitted to the recovery room and then will be discharged home today with her son.  She will be instructed to return to my office on January 23, 2017, at 3:45 p.m.  DISCHARGE MEDICATIONS:  Will include:  1. Clindamycin 300 mg p.o. t.i.d. x10 days with food (as she is     allergic to PENICILLIN). 2. Norco 5/325 1 p.o. q.4 hours p.r.n. pain x3 days (#10 tabs) 3. Mupirocin ointment 2% 22-g tube, apply around the abutment b.i.d.     for 1 week.  The patient will be instructed to keep her head elevated on 2 pillows for the next week, avoid direct trauma over the implant, follow a regular diet, and call [phone] for any postoperative problems directly related to the procedure.  She will be given both verbal and written instructions.  SUMMARY:  The patient underwent an uncomplicated right BAHA hearing implant.  A BIA400, 4 x 12 mm titanium  implant plus abutment was used.  There were no complications.   Fannie Knee, M.D.   EMK/MEDQ  D:  01/16/2017  T:  01/16/2017  Job:  [id number]  cc:   Fannie Knee, M.D.'s office [name], M.D.

## 2017-05-26 ENCOUNTER — Encounter: Payer: Self-pay | Admitting: Family Medicine

## 2017-06-27 ENCOUNTER — Ambulatory Visit: Payer: 59 | Admitting: Family Medicine

## 2017-07-03 ENCOUNTER — Ambulatory Visit (INDEPENDENT_AMBULATORY_CARE_PROVIDER_SITE_OTHER): Payer: 59 | Admitting: Family Medicine

## 2017-07-03 ENCOUNTER — Encounter: Payer: Self-pay | Admitting: Family Medicine

## 2017-07-03 VITALS — BP 137/78 | HR 69 | Ht 61.0 in | Wt 127.0 lb

## 2017-07-03 DIAGNOSIS — Z0184 Encounter for antibody response examination: Secondary | ICD-10-CM

## 2017-07-03 DIAGNOSIS — Z111 Encounter for screening for respiratory tuberculosis: Secondary | ICD-10-CM

## 2017-07-03 NOTE — Progress Notes (Signed)
   Subjective:    Patient ID: Catherine Monroe, female    DOB: 1960/07/17, 57 y.o.   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  She wants to make sure that all her vaccinations are up-to-date.  She brought a log of her vaccinations from employee health and then combined with what we have.  Upon review it looks like she needs a blood titer for mumps, and gold interferon as she needs to be screening.     Review of Systems     Objective:   Physical Exam  Constitutional: She is oriented to person, place, and time. She appears well-developed and well-nourished.  HENT:  Head: Normocephalic and atraumatic.  Eyes: Conjunctivae and EOM are normal.  Cardiovascular: Normal rate.  Pulmonary/Chest: Effort normal.  Neurological: She is alert and oriented to person, place, and time.  Skin: Skin is dry. No pallor.  Psychiatric: She has a normal mood and affect. Her behavior is normal.  Vitals reviewed.         Assessment & Plan:  Immune status testing-vaccinations-her tetanus is up-to-date and we provided a copy for her.  It was done in 2013.  We will do gold interferon for TB screening and will do a titer for status immunity for measles mumps and rubella.  Once we get that she should be complete.  We provided her with the vaccine record that we currently have here.  She has a problem at all please let us know.

## 2017-07-05 LAB — QUANTIFERON-TB GOLD PLUS
Mitogen-NIL: >10 IU/mL
NIL: 0.02 [IU]/mL
QuantiFERON-TB Gold Plus: NEGATIVE
TB1-NIL: 0 IU/mL
TB2-NIL: <0 IU/mL

## 2017-07-05 LAB — MEASLES/MUMPS/RUBELLA IMMUNITY
Rubella: 2.08 index
Rubeola IgG: 247 AU/mL

## 2017-07-28 ENCOUNTER — Other Ambulatory Visit: Payer: Self-pay | Admitting: Surgery

## 2017-07-28 ENCOUNTER — Other Ambulatory Visit: Payer: Self-pay | Admitting: Family Medicine

## 2017-07-28 DIAGNOSIS — Z9889 Other specified postprocedural states: Secondary | ICD-10-CM

## 2017-09-03 ENCOUNTER — Ambulatory Visit
Admission: RE | Admit: 2017-09-03 | Discharge: 2017-09-03 | Disposition: A | Discharge status: Home / Self Care | Payer: 59 | Source: Ambulatory Visit | Attending: Family Medicine | Admitting: Family Medicine

## 2017-09-03 DIAGNOSIS — Z9889 Other specified postprocedural states: Secondary | ICD-10-CM

## 2017-09-03 DIAGNOSIS — R922 Inconclusive mammogram: Secondary | ICD-10-CM | POA: Diagnosis not present

## 2017-09-03 HISTORY — DX: Personal history of irradiation: Z92.3

## 2017-11-03 ENCOUNTER — Ambulatory Visit (INDEPENDENT_AMBULATORY_CARE_PROVIDER_SITE_OTHER): Payer: 59 | Admitting: Family Medicine

## 2017-11-03 ENCOUNTER — Encounter: Payer: Self-pay | Admitting: Family Medicine

## 2017-11-03 VITALS — BP 146/89 | HR 67 | Ht 61.0 in | Wt 125.0 lb

## 2017-11-03 DIAGNOSIS — Z0000 Encounter for general adult medical examination without abnormal findings: Secondary | ICD-10-CM

## 2017-11-03 NOTE — Patient Instructions (Signed)
Health Maintenance for Postmenopausal Women Menopause is a normal process in which your reproductive ability comes to an end. This process happens gradually over a span of months to years, usually between the ages of 22 and 9. Menopause is complete when you have missed 12 consecutive menstrual periods. It is important to talk with your health care provider about some of the most common conditions that affect postmenopausal women, such as heart disease, cancer, and bone loss (osteoporosis). Adopting a healthy lifestyle and getting preventive care can help to promote your health and wellness. Those actions can also lower your chances of developing some of these common conditions. What should I know about menopause? During menopause, you may experience a number of symptoms, such as:  Moderate-to-severe hot flashes.  Night sweats.  Decrease in sex drive.  Mood swings.  Headaches.  Tiredness.  Irritability.  Memory problems.  Insomnia.  Choosing to treat or not to treat menopausal changes is an individual decision that you make with your health care provider. What should I know about hormone replacement therapy and supplements? Hormone therapy products are effective for treating symptoms that are associated with menopause, such as hot flashes and night sweats. Hormone replacement carries certain risks, especially as you become older. If you are thinking about using estrogen or estrogen with progestin treatments, discuss the benefits and risks with your health care provider. What should I know about heart disease and stroke? Heart disease, heart attack, and stroke become more likely as you age. This may be due, in part, to the hormonal changes that your body experiences during menopause. These can affect how your body processes dietary fats, triglycerides, and cholesterol. Heart attack and stroke are both medical emergencies. There are many things that you can do to help prevent heart disease  and stroke:  Have your blood pressure checked at least every 1-2 years. High blood pressure causes heart disease and increases the risk of stroke.  If you are 53-22 years old, ask your health care provider if you should take aspirin to prevent a heart attack or a stroke.  Do not use any tobacco products, including cigarettes, chewing tobacco, or electronic cigarettes. If you need help quitting, ask your health care provider.  It is important to eat a healthy diet and maintain a healthy weight. ? Be sure to include plenty of vegetables, fruits, low-fat dairy products, and lean protein. ? Avoid eating foods that are high in solid fats, added sugars, or salt (sodium).  Get regular exercise. This is one of the most important things that you can do for your health. ? Try to exercise for at least 150 minutes each week. The type of exercise that you do should increase your heart rate and make you sweat. This is known as moderate-intensity exercise. ? Try to do strengthening exercises at least twice each week. Do these in addition to the moderate-intensity exercise.  Know your numbers.Ask your health care provider to check your cholesterol and your blood glucose. Continue to have your blood tested as directed by your health care provider.  What should I know about cancer screening? There are several types of cancer. Take the following steps to reduce your risk and to catch any cancer development as early as possible. Breast Cancer  Practice breast self-awareness. ? This means understanding how your breasts normally appear and feel. ? It also means doing regular breast self-exams. Let your health care provider know about any changes, no matter how small.  If you are 40  or older, have a clinician do a breast exam (clinical breast exam or CBE) every year. Depending on your age, family history, and medical history, it may be recommended that you also have a yearly breast X-ray (mammogram).  If you  have a family history of breast cancer, talk with your health care provider about genetic screening.  If you are at high risk for breast cancer, talk with your health care provider about having an MRI and a mammogram every year.  Breast cancer (BRCA) gene test is recommended for women who have family members with BRCA-related cancers. Results of the assessment will determine the need for genetic counseling and BRCA1 and for BRCA2 testing. BRCA-related cancers include these types: ? Breast. This occurs in males or females. ? Ovarian. ? Tubal. This may also be called fallopian tube cancer. ? Cancer of the abdominal or pelvic lining (peritoneal cancer). ? Prostate. ? Pancreatic.  Cervical, Uterine, and Ovarian Cancer Your health care provider may recommend that you be screened regularly for cancer of the pelvic organs. These include your ovaries, uterus, and vagina. This screening involves a pelvic exam, which includes checking for microscopic changes to the surface of your cervix (Pap test).  For women ages 21-65, health care providers may recommend a pelvic exam and a Pap test every three years. For women ages 79-65, they may recommend the Pap test and pelvic exam, combined with testing for human papilloma virus (HPV), every five years. Some types of HPV increase your risk of cervical cancer. Testing for HPV may also be done on women of any age who have unclear Pap test results.  Other health care providers may not recommend any screening for nonpregnant women who are considered low risk for pelvic cancer and have no symptoms. Ask your health care provider if a screening pelvic exam is right for you.  If you have had past treatment for cervical cancer or a condition that could lead to cancer, you need Pap tests and screening for cancer for at least 20 years after your treatment. If Pap tests have been discontinued for you, your risk factors (such as having a new sexual partner) need to be  reassessed to determine if you should start having screenings again. Some women have medical problems that increase the chance of getting cervical cancer. In these cases, your health care provider may recommend that you have screening and Pap tests more often.  If you have a family history of uterine cancer or ovarian cancer, talk with your health care provider about genetic screening.  If you have vaginal bleeding after reaching menopause, tell your health care provider.  There are currently no reliable tests available to screen for ovarian cancer.  Lung Cancer Lung cancer screening is recommended for adults 69-62 years old who are at high risk for lung cancer because of a history of smoking. A yearly low-dose CT scan of the lungs is recommended if you:  Currently smoke.  Have a history of at least 30 pack-years of smoking and you currently smoke or have quit within the past 15 years. A pack-year is smoking an average of one pack of cigarettes per day for one year.  Yearly screening should:  Continue until it has been 15 years since you quit.  Stop if you develop a health problem that would prevent you from having lung cancer treatment.  Colorectal Cancer  This type of cancer can be detected and can often be prevented.  Routine colorectal cancer screening usually begins at  age 42 and continues through age 45.  If you have risk factors for colon cancer, your health care provider may recommend that you be screened at an earlier age.  If you have a family history of colorectal cancer, talk with your health care provider about genetic screening.  Your health care provider may also recommend using home test kits to check for hidden blood in your stool.  A small camera at the end of a tube can be used to examine your colon directly (sigmoidoscopy or colonoscopy). This is done to check for the earliest forms of colorectal cancer.  Direct examination of the colon should be repeated every  5-10 years until age 71. However, if early forms of precancerous polyps or small growths are found or if you have a family history or genetic risk for colorectal cancer, you may need to be screened more often.  Skin Cancer  Check your skin from head to toe regularly.  Monitor any moles. Be sure to tell your health care provider: ? About any new moles or changes in moles, especially if there is a change in a mole's shape or color. ? If you have a mole that is larger than the size of a pencil eraser.  If any of your family members has a history of skin cancer, especially at a young age, talk with your health care provider about genetic screening.  Always use sunscreen. Apply sunscreen liberally and repeatedly throughout the day.  Whenever you are outside, protect yourself by wearing long sleeves, pants, a wide-brimmed hat, and sunglasses.  What should I know about osteoporosis? Osteoporosis is a condition in which bone destruction happens more quickly than new bone creation. After menopause, you may be at an increased risk for osteoporosis. To help prevent osteoporosis or the bone fractures that can happen because of osteoporosis, the following is recommended:  If you are 46-71 years old, get at least 1,000 mg of calcium and at least 600 mg of vitamin D per day.  If you are older than age 55 but younger than age 65, get at least 1,200 mg of calcium and at least 600 mg of vitamin D per day.  If you are older than age 54, get at least 1,200 mg of calcium and at least 800 mg of vitamin D per day.  Smoking and excessive alcohol intake increase the risk of osteoporosis. Eat foods that are rich in calcium and vitamin D, and do weight-bearing exercises several times each week as directed by your health care provider. What should I know about how menopause affects my mental health? Depression may occur at any age, but it is more common as you become older. Common symptoms of depression  include:  Low or sad mood.  Changes in sleep patterns.  Changes in appetite or eating patterns.  Feeling an overall lack of motivation or enjoyment of activities that you previously enjoyed.  Frequent crying spells.  Talk with your health care provider if you think that you are experiencing depression. What should I know about immunizations? It is important that you get and maintain your immunizations. These include:  Tetanus, diphtheria, and pertussis (Tdap) booster vaccine.  Influenza every year before the flu season begins.  Pneumonia vaccine.  Shingles vaccine.  Your health care provider may also recommend other immunizations. This information is not intended to replace advice given to you by your health care provider. Make sure you discuss any questions you have with your health care provider. Document Released: 03/08/2005  Document Revised: 08/04/2015 Document Reviewed: 10/18/2014 Elsevier Interactive Patient Education  2018 [employer] Inc[employer]  

## 2017-11-03 NOTE — Progress Notes (Signed)
Subjective:     Catherine Monroe is a 57 y.o. female and is here for a comprehensive physical exam. The patient reports problems - Left knee pain x 2.5 weeks. She said she was kneeling down during 1 of her clinicals directly on her knees on a hard floor for maybe a few minutes.  She was expensing any pain or discomfort at that time but in the next day noticed that her left knee was more sore and swollen.  She now she is having pain just a little bit more lateral to the patella and behind the knee.  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  Is been using some Motrin.not tried icing.    Social History   Socioeconomic History  . Marital status: Married    Spouse name: Not on file  . Number of children: 2  . Years of education: Not on file  . Highest education level: Not on file  Occupational History  . Occupation: Nurse  Social Needs  . Financial resource strain: Not on file  . Food insecurity:    Worry: Not on file    Inability: Not on file  . Transportation needs:    Medical: Not on file    Non-medical: Not on file  Tobacco Use  . Smoking status: Former Smoker    Packs/day: 1.00    Types: Cigarettes    Last attempt to quit: 01/29/1987    Years since quitting: 30.7  . Smokeless tobacco: Never Used  Substance and Sexual Activity  . Alcohol use: No    Alcohol/week: 0.0 standard drinks  . Drug use: No  . Sexual activity: Yes    Partners: Male  Lifestyle  . Physical activity:    Days per week: Not on file    Minutes per session: Not on file  . Stress: Not on file  Relationships  . Social connections:    Talks on phone: Not on file    Gets together: Not on file    Attends religious service: Not on file    Active member of club or organization: Not on file    Attends meetings of clubs or organizations: Not on file    Relationship status:  Not on file  . Intimate partner violence:    Fear of current or ex partner: Not on file    Emotionally abused: Not on file    Physically abused: Not on file    Forced sexual activity: Not on file  Other Topics Concern  . Not on file  Social History Narrative   Regular exercise. Runs 3 miles per day and does hot yoga.     Health Maintenance  Topic Date Due  . COLONOSCOPY  07/28/2012  . MAMMOGRAM  09/04/2019  . PAP SMEAR  10/06/2019  . TETANUS/TDAP  03/19/2021  . INFLUENZA VACCINE  Completed  . Hepatitis C Screening  Completed  . HIV Screening  Completed    The following portions of the patient's history were reviewed and updated as appropriate: allergies, current medications, past family history, past medical history, past social history, past surgical history and problem list.  Review of Systems A comprehensive review of systems was negative.   Objective:    BP (!) 146/89   Pulse 67   Ht 5\' 1"  (1.549 m)  Wt 125 lb (56.7 kg)   LMP 08/31/2011   SpO2 97%   BMI 23.62 kg/m  General appearance: alert, cooperative and appears stated age Head: Normocephalic, without obvious abnormality, atraumatic Eyes: conj clear, EOMi, PEERLA Ears: normal TM's and external ear canals both ears Nose: Nares normal. Septum midline. Mucosa normal. No drainage or sinus tenderness. Throat: lips, mucosa, and tongue normal; teeth and gums normal Neck: no adenopathy, no carotid bruit, no JVD, supple, symmetrical, trachea midline and thyroid not enlarged, symmetric, no tenderness/mass/nodules Back: symmetric, no curvature. ROM normal. No CVA tenderness. Lungs: clear to auscultation bilaterally Heart: regular rate and rhythm, S1, S2 normal, no murmur, click, rub or gallop Abdomen: soft, non-tender; bowel sounds normal; no masses,  no organomegaly Extremities: extremities normal, atraumatic, no cyanosis or edema Pulses: 2+ and symmetric Skin: Skin color, texture, turgor normal. No rashes or  lesions Lymph nodes: Cervical, supraclavicular, and axillary nodes normal. Neurologic: Alert and oriented X 3, normal strength and tone. Normal symmetric reflexes. Normal coordination and gait    Assessment:    Healthy female exam.    Plan:     See After Visit Summary for Counseling Recommendations   Keep up a regular exercise program and make sure you are eating a healthy diet Try to eat 4 servings of dairy a day, or if you are lactose intolerant take a calcium with vitamin D daily.  Your vaccines are up to date.  Pap is up to date.   Left knee pain -pain is mostly over the proximal end of the anterior tibialis.  Have her do exercises for the area.  She does have a little bit of pain behind the knee as well so if this persists then please let us know and we will try getting her in with 1 of our sports medicine docs.  Continue with taking ibuprofen and icing as needed.

## 2017-12-02 ENCOUNTER — Ambulatory Visit (INDEPENDENT_AMBULATORY_CARE_PROVIDER_SITE_OTHER): Payer: 59

## 2017-12-02 ENCOUNTER — Encounter: Payer: Self-pay | Admitting: Family Medicine

## 2017-12-02 ENCOUNTER — Ambulatory Visit (INDEPENDENT_AMBULATORY_CARE_PROVIDER_SITE_OTHER): Payer: 59 | Admitting: Family Medicine

## 2017-12-02 VITALS — BP 146/95 | HR 69 | Temp 98.0°F | Wt 127.0 lb

## 2017-12-02 DIAGNOSIS — M25562 Pain in left knee: Secondary | ICD-10-CM

## 2017-12-02 DIAGNOSIS — S76302A Unspecified injury of muscle, fascia and tendon of the posterior muscle group at thigh level, left thigh, initial encounter: Secondary | ICD-10-CM

## 2017-12-02 DIAGNOSIS — M1711 Unilateral primary osteoarthritis, right knee: Secondary | ICD-10-CM | POA: Diagnosis not present

## 2017-12-02 DIAGNOSIS — M1712 Unilateral primary osteoarthritis, left knee: Secondary | ICD-10-CM | POA: Diagnosis not present

## 2017-12-02 DIAGNOSIS — M170 Bilateral primary osteoarthritis of knee: Secondary | ICD-10-CM

## 2017-12-02 MED ORDER — DICLOFENAC SODIUM 1 % TD GEL: 4.0000 g | Freq: Four times a day (QID) | TRANSDERMAL | 11 refills | Status: DC

## 2017-12-02 NOTE — Patient Instructions (Addendum)
Thank you for coming in today.  Attend PT  The [facility] Protocol for Hamstring Strains  Lyman Bishop PT  Do the calf exercises.  Go from up position to down position slowly  Do about 30 reps 2x daily.   Use knee sleeve.  Body Helix full knee.   Continue diclofenac gel.   Recheck in 4-6 weeks if not better.

## 2017-12-02 NOTE — Progress Notes (Signed)
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  She continues to work while going to school.  She notes a 6-week history of left knee pain.  She cannot recall any specific injury that made of precipitated the knee pain.  She notes that she had been doing some kneeling before the knee pain started but cannot recall any other activity.  She notes knee fullness and pain and swelling.  She notes the anterior knee was initially painful but notes mostly the lateral and posterior knee.  She notes the pain is worse with activity and somewhat better with rest.  She denies any pain radiating below the level of the knee.  She is tried some topical diclofenac gel which helped only a little.  She notes however the pain is improving a bit of the last week or so.  Her pain was initially bad enough that she was unable to exercise but recently has been able to restart her exercise.  She predominately does yoga for exercise.  Past medical history, Surgical history, Family history not pertinant except as noted below, Social history, Allergies, and medications have been entered into the medical record, reviewed, and no changes needed.   Review of Systems: No headache, visual changes, nausea, vomiting, diarrhea, constipation, dizziness, abdominal pain, skin rash, fevers, chills, night sweats, weight loss, swollen lymph nodes, body aches, joint swelling, muscle aches, chest pain, shortness of breath, mood changes, visual or auditory hallucinations.   Objective:    Vitals:   12/02/17 0843  BP: (!) 146/95  Pulse: 69  Temp: 98 F (36.7 C)   General: Well Developed, well nourished, and in no acute distress.  Neuro/Psych: Alert and oriented x3, extra-ocular muscles intact, able to move all 4 extremities, sensation grossly  intact. Skin: Warm and dry, no rashes noted.  Respiratory: Not using accessory muscles, speaking in full sentences, trachea midline.  Cardiovascular: Pulses palpable, no extremity edema. Abdomen: Does not appear distended. MSK:  Left knee largely normal-appearing with no effusion or deformity. Range of motion 0-120 degrees. Mildly tender to palpation at posterior lateral knee. Stable ligamentous exam. Negative McMurray's test. Intact flexion and extension strength.  Contralateral right knee normal-appearing normal motion normal strength.    Lab and Radiology Results No results found for this or any previous visit (from the past 72 hour(s)). Dg Knee 1-2 Views Right  Result Date: 12/02/2017 CLINICAL DATA:  57 year old female with left knee pain for 6 weeks. Kneeling down on knees for extended period of time after which patient had pain and swelling left knee. No other injury. Prior right arthroscopic surgery. Initial encounter. EXAM: LEFT KNEE - COMPLETE 4+ VIEW; RIGHT KNEE - 1-2 VIEW COMPARISON:  None. FINDINGS: Left knee four views: No fracture or dislocation. Minimal medial tibiofemoral joint space narrowing. Minimal lateral patellofemoral joint space narrowing. No evidence of joint effusion. Right knee two views: Minimal medial and lateral tibiofemoral joint space narrowing. IMPRESSION: 1. Minimal left medial tibiofemoral joint space narrowing. 2. Minimal left lateral patellofemoral joint space narrowing. 3. Minimal right medial and lateral tibiofemoral joint space narrowing. Electronically Signed   By: Genia Del M.D.   On: 12/02/2017 10:24   Dg Knee Complete 4 Views Left  Result Date: 12/02/2017 CLINICAL DATA:  57 year old female with left knee  pain for 6 weeks. Kneeling down on knees for extended period of time after which patient had pain and swelling left knee. No other injury. Prior right arthroscopic surgery. Initial encounter. EXAM: LEFT KNEE - COMPLETE 4+ VIEW; RIGHT KNEE - 1-2  VIEW COMPARISON:  None. FINDINGS: Left knee four views: No fracture or dislocation. Minimal medial tibiofemoral joint space narrowing. Minimal lateral patellofemoral joint space narrowing. No evidence of joint effusion. Right knee two views: Minimal medial and lateral tibiofemoral joint space narrowing. IMPRESSION: 1. Minimal left medial tibiofemoral joint space narrowing. 2. Minimal left lateral patellofemoral joint space narrowing. 3. Minimal right medial and lateral tibiofemoral joint space narrowing. Electronically Signed   By: Genia Del M.D.   On: 12/02/2017 10:24  I personally (independently) visualized and performed the interpretation of the images attached in this note.   Impression and Recommendations:    Assessment and Plan: 57 y.o. female with  Left knee pain.  Somewhat concerning for hamstring tendinitis or proximal gastrocnemius tendinitis.  Meniscus injury could also be a possibility as well.  Plan for continue diclofenac gel compressive knee sleeve and eccentric exercises for calf and hamstring.  Refer to physical therapy and recheck in about a month or so if not improving consider injection versus MRI..   Orders Placed This Encounter  Procedures  . DG Knee Complete 4 Views Left    Please include patellar sunrise, lateral, and weightbearing bilateral AP and bilateral rosenberg views    Standing Status:   Future    Number of Occurrences:   1    Standing Expiration Date:   02/01/2019    Order Specific Question:   Reason for exam:    Answer:   Please include patellar sunrise, lateral, and weightbearing bilateral AP and bilateral rosenberg views    Comments:   Please include patellar sunrise, lateral, and weightbearing bilateral AP and bilateral rosenberg views    Order Specific Question:   Preferred imaging location?    Answer:   [facility]  . DG Knee 1-2 Views Right    Standing Status:   Future    Number of Occurrences:   1    Standing Expiration Date:   02/02/2019      Order Specific Question:   Reason for Exam (SYMPTOM  OR DIAGNOSIS REQUIRED)    Answer:   For use with the left knee x-ray bilateral AP and Rosenberg standing.    Order Specific Question:   Is the patient pregnant?    Answer:   No    Order Specific Question:   Preferred imaging location?    Answer:   [facility]  . Ambulatory referral to Physical Therapy    Referral Priority:   Routine    Referral Type:   Physical Medicine    Referral Reason:   Specialty Services Required    Requested Specialty:   Physical Therapy   Meds ordered this encounter  Medications  . diclofenac sodium (VOLTAREN) 1 % GEL    Sig: Apply 4 g topically 4 (four) times daily. To affected joint.    Dispense:  100 g    Refill:  11    Discussed warning signs or symptoms. Please see discharge instructions. Patient expresses understanding.

## 2017-12-03 ENCOUNTER — Encounter: Payer: Self-pay | Admitting: Family Medicine

## 2017-12-03 DIAGNOSIS — H5203 Hypermetropia, bilateral: Secondary | ICD-10-CM | POA: Diagnosis not present

## 2017-12-03 DIAGNOSIS — Z135 Encounter for screening for eye and ear disorders: Secondary | ICD-10-CM | POA: Diagnosis not present

## 2017-12-03 DIAGNOSIS — H524 Presbyopia: Secondary | ICD-10-CM | POA: Diagnosis not present

## 2017-12-08 ENCOUNTER — Ambulatory Visit: Payer: 59 | Admitting: Rehabilitative and Restorative Service Providers"

## 2017-12-10 DIAGNOSIS — H9011 Conductive hearing loss, unilateral, right ear, with unrestricted hearing on the contralateral side: Secondary | ICD-10-CM | POA: Diagnosis not present

## 2017-12-10 DIAGNOSIS — H95191 Other disorders following mastoidectomy, right ear: Secondary | ICD-10-CM | POA: Diagnosis not present

## 2017-12-15 DIAGNOSIS — M9903 Segmental and somatic dysfunction of lumbar region: Secondary | ICD-10-CM | POA: Diagnosis not present

## 2017-12-15 DIAGNOSIS — M9905 Segmental and somatic dysfunction of pelvic region: Secondary | ICD-10-CM | POA: Diagnosis not present

## 2017-12-15 DIAGNOSIS — M9906 Segmental and somatic dysfunction of lower extremity: Secondary | ICD-10-CM | POA: Diagnosis not present

## 2017-12-15 DIAGNOSIS — M7072 Other bursitis of hip, left hip: Secondary | ICD-10-CM | POA: Diagnosis not present

## 2017-12-16 ENCOUNTER — Ambulatory Visit: Payer: 59 | Admitting: Physical Therapy

## 2017-12-19 DIAGNOSIS — M7072 Other bursitis of hip, left hip: Secondary | ICD-10-CM | POA: Diagnosis not present

## 2017-12-19 DIAGNOSIS — M9905 Segmental and somatic dysfunction of pelvic region: Secondary | ICD-10-CM | POA: Diagnosis not present

## 2017-12-19 DIAGNOSIS — M9903 Segmental and somatic dysfunction of lumbar region: Secondary | ICD-10-CM | POA: Diagnosis not present

## 2017-12-19 DIAGNOSIS — M9906 Segmental and somatic dysfunction of lower extremity: Secondary | ICD-10-CM | POA: Diagnosis not present

## 2017-12-23 DIAGNOSIS — M9906 Segmental and somatic dysfunction of lower extremity: Secondary | ICD-10-CM | POA: Diagnosis not present

## 2017-12-23 DIAGNOSIS — M7072 Other bursitis of hip, left hip: Secondary | ICD-10-CM | POA: Diagnosis not present

## 2017-12-23 DIAGNOSIS — M9903 Segmental and somatic dysfunction of lumbar region: Secondary | ICD-10-CM | POA: Diagnosis not present

## 2017-12-23 DIAGNOSIS — M9905 Segmental and somatic dysfunction of pelvic region: Secondary | ICD-10-CM | POA: Diagnosis not present

## 2017-12-26 DIAGNOSIS — M7072 Other bursitis of hip, left hip: Secondary | ICD-10-CM | POA: Diagnosis not present

## 2017-12-26 DIAGNOSIS — M9903 Segmental and somatic dysfunction of lumbar region: Secondary | ICD-10-CM | POA: Diagnosis not present

## 2017-12-26 DIAGNOSIS — M9905 Segmental and somatic dysfunction of pelvic region: Secondary | ICD-10-CM | POA: Diagnosis not present

## 2017-12-26 DIAGNOSIS — M9906 Segmental and somatic dysfunction of lower extremity: Secondary | ICD-10-CM | POA: Diagnosis not present

## 2017-12-30 DIAGNOSIS — M9906 Segmental and somatic dysfunction of lower extremity: Secondary | ICD-10-CM | POA: Diagnosis not present

## 2017-12-30 DIAGNOSIS — M9905 Segmental and somatic dysfunction of pelvic region: Secondary | ICD-10-CM | POA: Diagnosis not present

## 2017-12-30 DIAGNOSIS — M9903 Segmental and somatic dysfunction of lumbar region: Secondary | ICD-10-CM | POA: Diagnosis not present

## 2017-12-30 DIAGNOSIS — M7072 Other bursitis of hip, left hip: Secondary | ICD-10-CM | POA: Diagnosis not present

## 2018-01-02 DIAGNOSIS — M9906 Segmental and somatic dysfunction of lower extremity: Secondary | ICD-10-CM | POA: Diagnosis not present

## 2018-01-02 DIAGNOSIS — M9905 Segmental and somatic dysfunction of pelvic region: Secondary | ICD-10-CM | POA: Diagnosis not present

## 2018-01-02 DIAGNOSIS — M9903 Segmental and somatic dysfunction of lumbar region: Secondary | ICD-10-CM | POA: Diagnosis not present

## 2018-01-02 DIAGNOSIS — M7072 Other bursitis of hip, left hip: Secondary | ICD-10-CM | POA: Diagnosis not present

## 2018-02-02 DIAGNOSIS — M25562 Pain in left knee: Secondary | ICD-10-CM | POA: Diagnosis not present

## 2018-03-02 DIAGNOSIS — M25462 Effusion, left knee: Secondary | ICD-10-CM | POA: Diagnosis not present

## 2018-03-02 DIAGNOSIS — M25562 Pain in left knee: Secondary | ICD-10-CM | POA: Diagnosis not present

## 2018-03-09 DIAGNOSIS — M25562 Pain in left knee: Secondary | ICD-10-CM | POA: Diagnosis not present

## 2018-03-13 DIAGNOSIS — S83282D Other tear of lateral meniscus, current injury, left knee, subsequent encounter: Secondary | ICD-10-CM | POA: Diagnosis not present

## 2018-04-10 DIAGNOSIS — M25562 Pain in left knee: Secondary | ICD-10-CM | POA: Diagnosis not present

## 2018-05-13 ENCOUNTER — Encounter: Payer: Self-pay | Admitting: Family Medicine

## 2018-05-13 DIAGNOSIS — Z111 Encounter for screening for respiratory tuberculosis: Secondary | ICD-10-CM

## 2018-06-24 DIAGNOSIS — Z111 Encounter for screening for respiratory tuberculosis: Secondary | ICD-10-CM | POA: Diagnosis not present

## 2018-06-25 DIAGNOSIS — S83272A Complex tear of lateral meniscus, current injury, left knee, initial encounter: Secondary | ICD-10-CM | POA: Diagnosis not present

## 2018-06-26 ENCOUNTER — Encounter: Payer: Self-pay | Admitting: Family Medicine

## 2018-06-26 LAB — QUANTIFERON-TB GOLD PLUS
Mitogen-NIL: >10 IU/mL
NIL: 0.04 IU/mL
QuantiFERON-TB Gold Plus: NEGATIVE
TB1-NIL: 0 IU/mL
TB2-NIL: 0.01 IU/mL

## 2018-08-12 ENCOUNTER — Other Ambulatory Visit: Payer: Self-pay | Admitting: Family Medicine

## 2018-08-12 DIAGNOSIS — Z9889 Other specified postprocedural states: Secondary | ICD-10-CM

## 2018-09-16 ENCOUNTER — Other Ambulatory Visit: Payer: Self-pay

## 2018-09-16 ENCOUNTER — Ambulatory Visit
Admission: RE | Admit: 2018-09-16 | Discharge: 2018-09-16 | Disposition: A | Discharge status: Home / Self Care | Payer: 59 | Source: Ambulatory Visit | Attending: Family Medicine | Admitting: Family Medicine

## 2018-09-16 DIAGNOSIS — Z9889 Other specified postprocedural states: Secondary | ICD-10-CM

## 2018-09-16 DIAGNOSIS — R922 Inconclusive mammogram: Secondary | ICD-10-CM | POA: Diagnosis not present

## 2018-11-16 ENCOUNTER — Encounter: Payer: 59 | Admitting: Family Medicine

## 2018-11-19 ENCOUNTER — Encounter: Payer: 59 | Admitting: Family Medicine

## 2018-11-20 ENCOUNTER — Encounter: Payer: 59 | Admitting: Family Medicine

## 2019-01-06 ENCOUNTER — Encounter: Payer: Self-pay | Admitting: Family Medicine

## 2019-05-31 ENCOUNTER — Encounter: Payer: 59 | Admitting: Family Medicine

## 2019-07-20 ENCOUNTER — Encounter: Payer: Self-pay | Admitting: Family Medicine

## 2019-07-20 ENCOUNTER — Other Ambulatory Visit (HOSPITAL_COMMUNITY)
Admission: RE | Admit: 2019-07-20 | Discharge: 2019-07-20 | Disposition: A | Discharge status: Home / Self Care | Payer: 59 | Source: Ambulatory Visit | Attending: Family Medicine | Admitting: Family Medicine

## 2019-07-20 ENCOUNTER — Ambulatory Visit (INDEPENDENT_AMBULATORY_CARE_PROVIDER_SITE_OTHER): Payer: 59 | Admitting: Family Medicine

## 2019-07-20 VITALS — BP 146/79 | HR 76 | Ht 61.0 in | Wt 147.0 lb

## 2019-07-20 DIAGNOSIS — R0789 Other chest pain: Secondary | ICD-10-CM

## 2019-07-20 DIAGNOSIS — Z124 Encounter for screening for malignant neoplasm of cervix: Secondary | ICD-10-CM | POA: Insufficient documentation

## 2019-07-20 DIAGNOSIS — N841 Polyp of cervix uteri: Secondary | ICD-10-CM | POA: Diagnosis not present

## 2019-07-20 DIAGNOSIS — Z0000 Encounter for general adult medical examination without abnormal findings: Secondary | ICD-10-CM

## 2019-07-20 LAB — CBC
HCT: 42.3 % (ref 35.0–45.0)
Hemoglobin: 14 g/dL (ref 11.7–15.5)
MCH: 29.1 pg (ref 27.0–33.0)
MCHC: 33.1 g/dL (ref 32.0–36.0)
MCV: 87.9 fL (ref 80.0–100.0)
MPV: 10.9 fL (ref 7.5–12.5)
Platelets: 246 10*3/uL (ref 140–400)
RBC: 4.81 10*6/uL (ref 3.80–5.10)
RDW: 12.4 % (ref 11.0–15.0)
WBC: 4.8 10*3/uL (ref 3.8–10.8)

## 2019-07-20 LAB — COMPLETE METABOLIC PANEL WITH GFR
AG Ratio: 1.8 (calc) (ref 1.0–2.5)
ALT: 36 U/L — ABNORMAL HIGH (ref 6–29)
AST: 23 U/L (ref 10–35)
Albumin: 4.4 g/dL (ref 3.6–5.1)
Alkaline phosphatase (APISO): 108 U/L (ref 37–153)
BUN: 14 mg/dL (ref 7–25)
CO2: 29 mmol/L (ref 20–32)
Calcium: 9.4 mg/dL (ref 8.6–10.4)
Chloride: 103 mmol/L (ref 98–110)
Creat: 0.62 mg/dL (ref 0.50–1.05)
GFR, Est African American: 117 mL/min/{1.73_m2} (ref >=60)
GFR, Est Non African American: 101 mL/min/{1.73_m2} (ref >=60)
Globulin: 2.5 g/dL (calc) (ref 1.9–3.7)
Glucose, Bld: 98 mg/dL (ref 65–99)
Potassium: 4.1 mmol/L (ref 3.5–5.3)
Sodium: 141 mmol/L (ref 135–146)
Total Bilirubin: 0.6 mg/dL (ref 0.2–1.2)
Total Protein: 6.9 g/dL (ref 6.1–8.1)

## 2019-07-20 LAB — LIPID PANEL
Cholesterol: 235 mg/dL — ABNORMAL HIGH (ref <200)
HDL: 66 mg/dL (ref >=50)
LDL Cholesterol (Calc): 143 mg/dL (calc) — ABNORMAL HIGH
Non-HDL Cholesterol (Calc): 169 mg/dL (calc) — ABNORMAL HIGH (ref <130)
Total CHOL/HDL Ratio: 3.6 (calc) (ref <5.0)
Triglycerides: 131 mg/dL (ref <150)

## 2019-07-20 NOTE — Progress Notes (Signed)
Subjective:     Catherine Monroe is a 59 y.o. female and is here for a comprehensive physical exam. The patient reports no problems.  Social History   Socioeconomic History  . Marital status: Married    Spouse name: Not on file  . Number of children: 2  . Years of education: Not on file  . Highest education level: Not on file  Occupational History  . Occupation: Nurse  Tobacco Use  . Smoking status: Former Smoker    Packs/day: 1.00    Types: Cigarettes    Quit date: 01/29/1987    Years since quitting: 32.4  . Smokeless tobacco: Never Used  Vaping Use  . Vaping Use: Never used  Substance and Sexual Activity  . Alcohol use: No    Alcohol/week: 0.0 standard drinks  . Drug use: No  . Sexual activity: Yes    Partners: Male  Other Topics Concern  . Not on file  Social History Narrative   Regular exercise. Runs 3 miles per day and does hot yoga.     Social Determinants of Health   Financial Resource Strain:   . Difficulty of Paying Living Expenses:   Food Insecurity:   . Worried About Charity fundraiser in the Last Year:   . Arboriculturist in the Last Year:   Transportation Needs:   . Film/video editor (Medical):   Marland Kitchen Lack of Transportation (Non-Medical):   Physical Activity:   . Days of Exercise per Week:   . Minutes of Exercise per Session:   Stress:   . Feeling of Stress :   Social Connections:   . Frequency of Communication with Friends and Family:   . Frequency of Social Gatherings with Friends and Family:   . Attends Religious Services:   . Active Member of Clubs or Organizations:   . Attends Archivist Meetings:   Marland Kitchen Marital Status:   Intimate Partner Violence:   . Fear of Current or Ex-Partner:   . Emotionally Abused:   Marland Kitchen Physically Abused:   . Sexually Abused:    Health Maintenance  Topic Date Due  . COLONOSCOPY  Never done  . INFLUENZA VACCINE  08/29/2019  . PAP SMEAR-Modifier  10/06/2019  . MAMMOGRAM  09/15/2020  . TETANUS/TDAP   03/19/2021  . COVID-19 Vaccine  Completed  . Hepatitis C Screening  Completed  . HIV Screening  Completed    The following portions of the patient's history were reviewed and updated as appropriate: allergies, current medications, past family history, past medical history, past social history, past surgical history and problem list.  Review of Systems A comprehensive review of systems was negative.   Objective:    BP (!) 146/79   Pulse 76   Ht 5\' 1"  (1.549 m)   Wt 147 lb (66.7 kg)   LMP 08/31/2011   SpO2 99%   BMI 27.78 kg/m  General appearance: alert, cooperative and appears stated age Head: Normocephalic, without obvious abnormality, atraumatic Eyes: conj clear, EOMI, PEERLA Ears: normal TM's and external ear canals both ears Nose: Nares normal. Septum midline. Mucosa normal. No drainage or sinus tenderness. Throat: lips, mucosa, and tongue normal; teeth and gums normal Neck: no adenopathy, no carotid bruit, no JVD, supple, symmetrical, trachea midline and thyroid not enlarged, symmetric, no tenderness/mass/nodules Back: symmetric, no curvature. ROM normal. No CVA tenderness. Lungs: clear to auscultation bilaterally Breasts: normal appearance, no masses or tenderness Heart: regular rate and rhythm, S1, S2 normal, no  murmur, click, rub or gallop Abdomen: soft, non-tender; bowel sounds normal; no masses,  no organomegaly Pelvic: cervix normal in appearance, external genitalia normal, no adnexal masses or tenderness, no cervical motion tenderness, rectovaginal septum normal, uterus normal size, shape, and consistency, vagina normal without discharge and small cervicla polyp Extremities: extremities normal, atraumatic, no cyanosis or edema Pulses: 2+ and symmetric Skin: Skin color, texture, turgor normal. No rashes or lesions Lymph nodes: Cervical, supraclavicular, and axillary nodes normal. and scar on the left breast is well healed.  she has tender spot over her 2nd rib below  clavicle Neurologic: Alert and oriented X 3, normal strength and tone. Normal symmetric reflexes. Normal coordination and gait    Assessment:    Healthy female exam.     Plan:     See After Visit Summary for Counseling Recommendations   Keep up a regular exercise program and make sure you are eating a healthy diet Try to eat 4 servings of dairy a day, or if you are lactose intolerant take a calcium with vitamin D daily.  Your vaccines are up to date.    Cervical polyp - refer to GYN for removal.  Pap smear performed.    Left rib cage tenderness .  Some concern because of prior history of breast cancer and radiation to the left chest wall.  Could consider at least an x-ray just to make sure there is no significant bony abnormality or cystic lesion present.

## 2019-07-20 NOTE — Patient Instructions (Signed)
Health Maintenance, Female Adopting a healthy lifestyle and getting preventive care are important in promoting health and wellness. Ask your health care provider about:  The right schedule for you to have regular tests and exams.  Things you can do on your own to prevent diseases and keep yourself healthy. What should I know about diet, weight, and exercise? Eat a healthy diet   Eat a diet that includes plenty of vegetables, fruits, low-fat dairy products, and lean protein.  Do not eat a lot of foods that are high in solid fats, added sugars, or sodium. Maintain a healthy weight Body mass index (BMI) is used to identify weight problems. It estimates body fat based on height and weight. Your health care provider can help determine your BMI and help you achieve or maintain a healthy weight. Get regular exercise Get regular exercise. This is one of the most important things you can do for your health. Most adults should:  Exercise for at least 150 minutes each week. The exercise should increase your heart rate and make you sweat (moderate-intensity exercise).  Do strengthening exercises at least twice a week. This is in addition to the moderate-intensity exercise.  Spend less time sitting. Even light physical activity can be beneficial. Watch cholesterol and blood lipids Have your blood tested for lipids and cholesterol at 57 years of age, then have this test every 5 years. Have your cholesterol levels checked more often if:  Your lipid or cholesterol levels are high.  You are older than 57 years of age.  You are at high risk for heart disease. What should I know about cancer screening? Depending on your health history and family history, you may need to have cancer screening at various ages. This may include screening for:  Breast cancer.  Cervical cancer.  Colorectal cancer.  Skin cancer.  Lung cancer. What should I know about heart disease, diabetes, and high blood  pressure? Blood pressure and heart disease  High blood pressure causes heart disease and increases the risk of stroke. This is more likely to develop in people who have high blood pressure readings, are of African descent, or are overweight.  Have your blood pressure checked: ? Every 3-5 years if you are 18-39 years of age. ? Every year if you are 40 years old or older. Diabetes Have regular diabetes screenings. This checks your fasting blood sugar level. Have the screening done:  Once every three years after age 40 if you are at a normal weight and have a low risk for diabetes.  More often and at a younger age if you are overweight or have a high risk for diabetes. What should I know about preventing infection? Hepatitis B If you have a higher risk for hepatitis B, you should be screened for this virus. Talk with your health care provider to find out if you are at risk for hepatitis B infection. Hepatitis C Testing is recommended for:  Everyone born from 1945 through 1965.  Anyone with known risk factors for hepatitis C. Sexually transmitted infections (STIs)  Get screened for STIs, including gonorrhea and chlamydia, if: ? You are sexually active and are younger than 57 years of age. ? You are older than 57 years of age and your health care provider tells you that you are at risk for this type of infection. ? Your sexual activity has changed since you were last screened, and you are at increased risk for chlamydia or gonorrhea. Ask your health care provider if   you are at risk.  Ask your health care provider about whether you are at high risk for HIV. Your health care provider may recommend a prescription medicine to help prevent HIV infection. If you choose to take medicine to prevent HIV, you should first get tested for HIV. You should then be tested every 3 months for as long as you are taking the medicine. Pregnancy  If you are about to stop having your period (premenopausal) and  you may become pregnant, seek counseling before you get pregnant.  Take 400 to 800 micrograms (mcg) of folic acid every day if you become pregnant.  Ask for birth control (contraception) if you want to prevent pregnancy. Osteoporosis and menopause Osteoporosis is a disease in which the bones lose minerals and strength with aging. This can result in bone fractures. If you are 65 years old or older, or if you are at risk for osteoporosis and fractures, ask your health care provider if you should:  Be screened for bone loss.  Take a calcium or vitamin D supplement to lower your risk of fractures.  Be given hormone replacement therapy (HRT) to treat symptoms of menopause. Follow these instructions at home: Lifestyle  Do not use any products that contain nicotine or tobacco, such as cigarettes, e-cigarettes, and chewing tobacco. If you need help quitting, ask your health care provider.  Do not use street drugs.  Do not share needles.  Ask your health care provider for help if you need support or information about quitting drugs. Alcohol use  Do not drink alcohol if: ? Your health care provider tells you not to drink. ? You are pregnant, may be pregnant, or are planning to become pregnant.  If you drink alcohol: ? Limit how much you use to 0-1 drink a day. ? Limit intake if you are breastfeeding.  Be aware of how much alcohol is in your drink. In the [place] one drink equals one 12 oz bottle of beer (355 mL), one 5 oz glass of wine (148 mL), or one 1 oz glass of hard liquor (44 mL). General instructions  Schedule regular health, dental, and eye exams.  Stay current with your vaccines.  Tell your health care provider if: ? You often feel depressed. ? You have ever been abused or do not feel safe at home. Summary  Adopting a healthy lifestyle and getting preventive care are important in promoting health and wellness.  Follow your health care provider's instructions about healthy  diet, exercising, and getting tested or screened for diseases.  Follow your health care provider's instructions on monitoring your cholesterol and blood pressure. This information is not intended to replace advice given to you by your health care provider. Make sure you discuss any questions you have with your health care provider. Document Revised: 01/07/2018 Document Reviewed: 01/07/2018 Elsevier Patient Education  2020 [employer]  

## 2019-07-20 NOTE — Telephone Encounter (Signed)
HI Cindy, I put in for Dr. Runell Gess but if she is way booked out or not taking new patients then we can [facility]

## 2019-07-21 ENCOUNTER — Encounter: Payer: Self-pay | Admitting: Family Medicine

## 2019-07-21 LAB — CYTOLOGY - PAP
Comment: NEGATIVE
Diagnosis: NEGATIVE
High risk HPV: NEGATIVE

## 2019-07-21 NOTE — Telephone Encounter (Signed)
I have sent referral and info to Dr. Rickey Barbara office in [place]

## 2019-07-21 NOTE — Progress Notes (Signed)
our Pap smear is normal. Repeat in 5 years.

## 2019-08-05 ENCOUNTER — Encounter: Payer: Self-pay | Admitting: Family Medicine

## 2019-08-05 DIAGNOSIS — Z1211 Encounter for screening for malignant neoplasm of colon: Secondary | ICD-10-CM

## 2019-08-10 ENCOUNTER — Other Ambulatory Visit: Payer: Self-pay | Admitting: Obstetrics and Gynecology

## 2019-08-10 DIAGNOSIS — N841 Polyp of cervix uteri: Secondary | ICD-10-CM | POA: Diagnosis not present

## 2019-08-12 ENCOUNTER — Other Ambulatory Visit: Payer: Self-pay | Admitting: Family Medicine

## 2019-08-12 ENCOUNTER — Ambulatory Visit (INDEPENDENT_AMBULATORY_CARE_PROVIDER_SITE_OTHER): Payer: 59 | Admitting: Family Medicine

## 2019-08-12 ENCOUNTER — Ambulatory Visit (INDEPENDENT_AMBULATORY_CARE_PROVIDER_SITE_OTHER): Payer: 59

## 2019-08-12 ENCOUNTER — Other Ambulatory Visit: Payer: Self-pay

## 2019-08-12 ENCOUNTER — Encounter: Payer: Self-pay | Admitting: Family Medicine

## 2019-08-12 VITALS — BP 138/80 | HR 74 | Ht 61.0 in | Wt 147.0 lb

## 2019-08-12 DIAGNOSIS — D0462 Carcinoma in situ of skin of left upper limb, including shoulder: Secondary | ICD-10-CM | POA: Diagnosis not present

## 2019-08-12 DIAGNOSIS — R079 Chest pain, unspecified: Secondary | ICD-10-CM | POA: Diagnosis not present

## 2019-08-12 DIAGNOSIS — R0781 Pleurodynia: Secondary | ICD-10-CM

## 2019-08-12 DIAGNOSIS — L989 Disorder of the skin and subcutaneous tissue, unspecified: Secondary | ICD-10-CM | POA: Diagnosis not present

## 2019-08-12 DIAGNOSIS — Z1211 Encounter for screening for malignant neoplasm of colon: Secondary | ICD-10-CM | POA: Diagnosis not present

## 2019-08-12 NOTE — Progress Notes (Signed)
Established Patient Office Visit  Subjective:  Patient ID: Catherine Monroe, female    DOB: 16-Sep-1960  Age: 58 y.o. MRN: [id number]  CC:  Chief Complaint  Patient presents with  . Biopsy    L arm    HPI Catherine Monroe presents for follow-up of skin lesion on the left outer arm.  There is a raised small scaly area approximately 3 to 4 mm in size.  Is been there for a while.  Based on the appearance I was concerned that it could be an early squamous cell or inflamed actinic keratosis and recommend shave biopsy for more definitive treatment.  She did let me know that she did see GYN and had the cervical polyp removed.  In fact the results came back today on the pathology that it was normal which is very reassuring.  She still having some left upper rib pain over approximately the third rib just lateral to the sternum.  She says the bone itself actually feels tender it does not feel like it is over the costo chondral joint.  Past Medical History:  Diagnosis Date  . Breast cancer ([place]) 2016   left br  . Cancer [facility])    left breast cancer  . 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 Hypertension   . Personal history of radiation therapy     Past Surgical History:  Procedure Laterality Date  . BIOPSY BREAST  08/10/14   Left  . BONE ANCHORED HEARING AID IMPLANT Right 01/16/2017   Procedure: RIGHT BONE ANCHORED HEARING AID (BAHA) IMPLANT;  Surgeon: Vicie Mutters, MD;  Location: [facility];  Service: ENT;  Laterality: Right;  . BREAST BIOPSY    . BREAST CYST EXCISION     age 66, pilonidal cyst  . BREAST LUMPECTOMY Left 2016   at age 20  . BREAST LUMPECTOMY WITH NEEDLE LOCALIZATION AND AXILLARY SENTINEL LYMPH NODE BX Left 09/23/2014   Procedure: LEFT BREAST LUMPECTOMY WITH BRACKETED  NEEDLE LOCALIZATION AND LEFT AXILLARY SENTINEL LYMPH NODE BX;  Surgeon: Alphonsa Overall, MD;  Location: [place];  Service: General;  Laterality: Left;  . cyst removed     at age 82  . MASTOIDECTOMY  1970  . right knee scope for torn meniscus  2/10  . WISDOM TOOTH EXTRACTION      Family History  Problem Relation Age of Onset  . Hypertension Mother   . Hypertension Father   . Stroke Father   . Prostate cancer Father   . Sudden death Neg Hx   . Hyperlipidemia Neg Hx   . Heart attack Neg Hx   . Diabetes Neg Hx     Social History   Socioeconomic History  . Marital status: Married    Spouse name: Not on file  . Number of children: 2  . Years of education: Not on file  . Highest education level: Not on file  Occupational History  . Occupation: Nurse  Tobacco Use  . Smoking status: Former Smoker    Packs/day: 1.00    Types: Cigarettes    Quit date: 01/29/1987    Years since quitting: 32.5  . Smokeless tobacco: Never Used  Vaping Use  . Vaping Use: Never used  Substance and Sexual Activity  . Alcohol use: No    Alcohol/week: 0.0 standard drinks  . Drug use: No  .  Sexual activity: Yes    Partners: Male  Other Topics Concern  . Not on file  Social History Narrative   Regular exercise. Runs 3 miles per day and does hot yoga.     Social Determinants of Health   Financial Resource Strain:   . Difficulty of Paying Living Expenses:   Food Insecurity:   . Worried About Charity fundraiser in the Last Year:   . Arboriculturist in the Last Year:   Transportation Needs:   . Film/video editor (Medical):   Marland Kitchen Lack of Transportation (Non-Medical):   Physical Activity:   . Days of Exercise per Week:   . Minutes of Exercise per Session:   Stress:   . Feeling of Stress :   Social Connections:   . Frequency of Communication with Friends and Family:   . Frequency of Social Gatherings with Friends and Family:   . Attends Religious Services:   . Active Member of Clubs or Organizations:   . Attends Archivist Meetings:   Marland Kitchen Marital Status:   Intimate Partner Violence:    . Fear of Current or Ex-Partner:   . Emotionally Abused:   Marland Kitchen Physically Abused:   . Sexually Abused:     Outpatient Medications Prior to Visit  Medication Sig Dispense Refill  . MAGNESIUM PO Take 1 capsule by mouth daily.      No facility-administered medications prior to visit.    Allergies  Allergen Reactions  . Penicillins Hives, Itching and Other (See Comments)    Has patient had a PCN reaction causing immediate rash, facial/tongue/throat swelling, SOB or lightheadedness with hypotension: No Has patient had a PCN reaction causing severe rash involving mucus membranes or skin necrosis: No Has patient had a PCN reaction that required hospitalization: Yes Has patient had a PCN reaction occurring within the last 10 years: No If all of the above answers are "NO", then may proceed with Cephalosporin use.     ROS Review of Systems    Objective:    Physical Exam Vitals reviewed.  Constitutional:      Appearance: She is well-developed.  HENT:     Head: Normocephalic and atraumatic.  Eyes:     Conjunctiva/sclera: Conjunctivae normal.  Pulmonary:     Effort: Pulmonary effort is normal.  Chest:       Comments: Area of tenderness as above.  That rib is slightly prominent compared to the other ribs.  But I do not palpate a distinct mass or nodule. Skin:    General: Skin is dry.     Coloration: Skin is not pale.     Comments: Erythematous scaly approximately 3 to 4 mm lesion on the left upper arm sitting at the edge of a hypopigmented larger area measuring approximately 1.2 cm.  Neurological:     Mental Status: She is alert and oriented to person, place, and time.  Psychiatric:        Behavior: Behavior normal.     BP 138/80   Pulse 74   Ht 5\' 1"  (1.549 m)   Wt 147 lb (66.7 kg)   LMP 08/31/2011   SpO2 100%   BMI 27.78 kg/m  Wt Readings from Last 3 Encounters:  08/12/19 147 lb (66.7 kg)  07/20/19 147 lb (66.7 kg)  12/02/17 127 lb (57.6 kg)     Health  Maintenance Due  Topic Date Due  . COLONOSCOPY  Never done    There are no preventive care reminders  to display for this patient.  No results found for: TSH Lab Results  Component Value Date   WBC 4.8 07/20/2019   HGB 14.0 07/20/2019   HCT 42.3 07/20/2019   MCV 87.9 07/20/2019   PLT 246 07/20/2019   Lab Results  Component Value Date   NA 141 07/20/2019   K 4.1 07/20/2019   CO2 29 07/20/2019   GLUCOSE 98 07/20/2019   BUN 14 07/20/2019   CREATININE 0.62 07/20/2019   BILITOT 0.6 07/20/2019   ALKPHOS 83 10/27/2015   AST 23 07/20/2019   ALT 36 (H) 07/20/2019   PROT 6.9 07/20/2019   ALBUMIN 4.0 10/27/2015   CALCIUM 9.4 07/20/2019   ANIONGAP 9 01/09/2017   Lab Results  Component Value Date   CHOL 235 (H) 07/20/2019   Lab Results  Component Value Date   HDL 66 07/20/2019   Lab Results  Component Value Date   LDLCALC 143 (H) 07/20/2019   Lab Results  Component Value Date   TRIG 131 07/20/2019   Lab Results  Component Value Date   CHOLHDL 3.6 07/20/2019   No results found for: HGBA1C    Assessment & Plan:   Problem List Items Addressed This Visit    None    Visit Diagnoses    Rib pain on left side    -  Primary   Relevant Orders   DG Ribs Unilateral Left   Skin lesion       Relevant Orders   Surgical pathology     Shave Biopsy Procedure Note  Pre-operative Diagnosis: Suspicious lesion  Post-operative Diagnosis: same  Locations:lateral, left upper arm  Indications: suspicious  Anesthesia: Lidocaine 1% with epinephrine without added sodium bicarbonate  Procedure Details  Patient informed of the risks (including bleeding and infection) and benefits of the  procedure and Verbal informed consent obtained.  The lesion and surrounding area were given a sterile prep using chlorhexidine and draped in the usual sterile fashion. A scalpel was used to shave an area of skin approximately 40mm by 31mm.  Hemostasis achieved with alumuninum chloride.  Antibiotic ointment and a sterile dressing applied.  The specimen was sent for pathologic examination. The patient tolerated the procedure well.  EBL: trace  Findings: Await pathology   Condition: Stable  Complications: none.  Plan: 1. Instructed to keep the wound dry and covered for 24-48h and clean thereafter. 2. Warning signs of infection were reviewed.   3. Return PRN.  Rib pain it has been tender for several months at this point without any injury or trauma.  She has had a history of left-sided breast cancer and did receive radiation therapy.  We discussed getting just a rib film today just to make sure that there are no osseous lesions that are concerning.   No orders of the defined types were placed in this encounter.   Follow-up: Return if symptoms worsen or fail to improve.    [name], MD

## 2019-08-17 ENCOUNTER — Encounter: Payer: Self-pay | Admitting: Family Medicine

## 2019-08-18 ENCOUNTER — Encounter: Payer: Self-pay | Admitting: Family Medicine

## 2019-08-18 DIAGNOSIS — C4492 Squamous cell carcinoma of skin, unspecified: Secondary | ICD-10-CM

## 2019-08-18 LAB — COLOGUARD
COLOGUARD: NEGATIVE
Cologuard: NEGATIVE

## 2019-08-18 LAB — EXTERNAL GENERIC LAB PROCEDURE: COLOGUARD: NEGATIVE

## 2019-08-18 NOTE — Telephone Encounter (Signed)
Dermatology referral pended.

## 2019-08-23 ENCOUNTER — Other Ambulatory Visit: Payer: Self-pay | Admitting: Family Medicine

## 2019-08-23 DIAGNOSIS — Z9889 Other specified postprocedural states: Secondary | ICD-10-CM

## 2019-08-23 DIAGNOSIS — Z1231 Encounter for screening mammogram for malignant neoplasm of breast: Secondary | ICD-10-CM

## 2019-08-24 ENCOUNTER — Other Ambulatory Visit: Payer: Self-pay | Admitting: Family Medicine

## 2019-08-24 ENCOUNTER — Telehealth: Payer: Self-pay | Admitting: Family Medicine

## 2019-08-24 ENCOUNTER — Encounter: Payer: Self-pay | Admitting: Family Medicine

## 2019-08-24 DIAGNOSIS — Z9889 Other specified postprocedural states: Secondary | ICD-10-CM

## 2019-08-24 NOTE — Telephone Encounter (Signed)
Call patient: Cologuard results were negative.  Recommend repeat colon cancer screening in 3 years.

## 2019-08-24 NOTE — Telephone Encounter (Signed)
Left pt msg of normal results

## 2019-09-22 ENCOUNTER — Ambulatory Visit
Admission: RE | Admit: 2019-09-22 | Discharge: 2019-09-22 | Disposition: A | Discharge status: Home / Self Care | Payer: 59 | Source: Ambulatory Visit | Attending: Family Medicine | Admitting: Family Medicine

## 2019-09-22 ENCOUNTER — Other Ambulatory Visit: Payer: Self-pay

## 2019-09-22 DIAGNOSIS — Z9889 Other specified postprocedural states: Secondary | ICD-10-CM

## 2019-11-03 ENCOUNTER — Encounter: Payer: Self-pay | Admitting: Dermatology

## 2019-11-03 ENCOUNTER — Ambulatory Visit (INDEPENDENT_AMBULATORY_CARE_PROVIDER_SITE_OTHER): Payer: 59 | Admitting: Dermatology

## 2019-11-03 ENCOUNTER — Other Ambulatory Visit: Payer: Self-pay

## 2019-11-03 DIAGNOSIS — D0462 Carcinoma in situ of skin of left upper limb, including shoulder: Secondary | ICD-10-CM | POA: Diagnosis not present

## 2019-11-03 DIAGNOSIS — D099 Carcinoma in situ, unspecified: Secondary | ICD-10-CM

## 2019-11-03 NOTE — Progress Notes (Signed)
Left arm - curet 5FU 1.4cm size Tx

## 2019-11-03 NOTE — Patient Instructions (Addendum)
First visit for [facility] DOB date of birth 02/20/1960.  She is referred by Dr. 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  Examination showed a fibrotic indented 1 cm lesion with some residual pink scaly crust on the margins.  [place] I spent some time discussing the nature of in situ carcinoma and the treatment options.  We proceeded to use lidocaine anesthetic after Hibiclens and to triple curette the lesion. There was essentially no depth but some fibrosis of the edges and some residual carcinoma which was also treated with inoculation fluorouracil.  She can expect to have a series of crusts and peeling for the next couple of weeks. There are no activity restrictions.  Daily application of either Vaseline or if no allergy triple antibiotic is indicated. I am reachable 24/7 if there is any problem; my cell number is [phone].  Please give the office a call in 3 weeks and leave me a message that you are doing okay or do it via [facility]. When we do a quick look in 10 weeks I would like to schedule adequate time to do a general skin examination.

## 2019-12-11 ENCOUNTER — Encounter: Payer: Self-pay | Admitting: Dermatology

## 2019-12-11 NOTE — Progress Notes (Signed)
   New Patient   Subjective  Catherine Monroe is a 59 y.o. female who presents for the following: Procedure (here to have left arm tx'd- bx at PCP- sccx1), Skin Tag, and Warts.  CIS Location: Left arm Duration:  Quality:  Associated Signs/Symptoms: Modifying Factors:  Severity:  Timing: Context: Primary care physician [name] obtained biopsy.  I reviewed the pathology report which was done by a dermatopathologist.   The following portions of the chart were reviewed this encounter and updated as appropriate: Tobacco  Allergies  Meds  Problems  Med Hx  Surg Hx  Fam Hx      Objective  Well appearing patient in no apparent distress; mood and affect are within normal limits.  A focused examination was performed including Head, neck, arms.. Relevant physical exam findings are noted in the Assessment and Plan.   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  Destruction of lesion Complexity: simple   Destruction method: electrodesiccation and curettage   Informed consent: discussed and consent obtained   Timeout:  patient name, date of birth, surgical site, and procedure verified Anesthesia: the lesion was anesthetized in a standard fashion   Anesthetic:  1% lidocaine w/ epinephrine 1-100,000 local infiltration Curettage performed in three different directions: Yes   Curettage cycles:  3 Lesion length (cm):  1.4 Lesion width (cm):  1 Margin per side (cm):  0 Final wound size (cm):  1.4 Hemostasis achieved with:  ferric subsulfate Outcome: patient tolerated procedure well with no complications   Post-procedure details: sterile dressing applied and wound care instructions given   Dressing type: bandage and petrolatum   Additional details:  Wound inoculated with 5% Fluorouracil.    Destruction of lesion

## 2020-01-12 ENCOUNTER — Ambulatory Visit (INDEPENDENT_AMBULATORY_CARE_PROVIDER_SITE_OTHER): Payer: 59 | Admitting: Dermatology

## 2020-01-12 ENCOUNTER — Other Ambulatory Visit: Payer: Self-pay

## 2020-01-12 ENCOUNTER — Encounter: Payer: Self-pay | Admitting: Dermatology

## 2020-01-12 DIAGNOSIS — Z1283 Encounter for screening for malignant neoplasm of skin: Secondary | ICD-10-CM

## 2020-01-12 DIAGNOSIS — L720 Epidermal cyst: Secondary | ICD-10-CM

## 2020-01-12 DIAGNOSIS — L821 Other seborrheic keratosis: Secondary | ICD-10-CM | POA: Diagnosis not present

## 2020-01-12 DIAGNOSIS — Z85828 Personal history of other malignant neoplasm of skin: Secondary | ICD-10-CM

## 2020-01-12 NOTE — Progress Notes (Signed)
   Follow-Up Visit   Subjective  Catherine Monroe is a 59 y.o. female who presents for the following: Follow-up (3 month- f/u left arm- healing good no concerns).  General skin examination Location:  Duration:  Quality:  Associated Signs/Symptoms: Modifying Factors:  Severity:  Timing: Context: CIS left upper arm treated 6 months ago  Objective  Well appearing patient in no apparent distress; mood and affect are within normal limits. Objective  Left Upper Back: Textured tan 3 mm flattopped papule  Objective  Mid Back: 8 mm noninflamed dermal papule  Objective  Chest - Medial [facility]): General skin examination, no atypical moles, melanoma, new or recurrent nonmole skin cancer.  Objective  Left Upper Arm - Anterior: No scale but slightly hypertrophic scar which patient claims is related to the biopsy not the skin cancer removal.  No sign recurrence.   A full examination was performed including scalp, head, eyes, ears, nose, lips, neck, chest, axillae, abdomen, back, buttocks, bilateral upper extremities, bilateral lower extremities, hands, feet, fingers, toes, fingernails, and toenails. All findings within normal limits unless otherwise noted below.   Assessment & Plan    Seborrheic keratosis Left Upper Back  Leave if stable  Epidermal cyst Mid Back  Leave if stable  Screening exam for skin cancer Chest - Medial [facility])  Annual skin examination  Personal history of skin cancer Left Upper Arm - Anterior  Annual skin examination; patient encouraged to examine her own skin twice annually.  UV protection  LN2 right biceps   I, Lavonna Monarch, MD, have reviewed all documentation for this visit.  The documentation on 01/16/20 for the exam, diagnosis, procedures, and orders are all accurate and complete.

## 2020-07-21 ENCOUNTER — Encounter: Payer: Self-pay | Admitting: Medical-Surgical

## 2020-07-21 ENCOUNTER — Ambulatory Visit (INDEPENDENT_AMBULATORY_CARE_PROVIDER_SITE_OTHER): Payer: 59 | Admitting: Medical-Surgical

## 2020-07-21 ENCOUNTER — Other Ambulatory Visit: Payer: Self-pay

## 2020-07-21 VITALS — BP 131/82 | HR 72 | Temp 98.1°F | Ht 61.5 in | Wt 152.0 lb

## 2020-07-21 DIAGNOSIS — Z1329 Encounter for screening for other suspected endocrine disorder: Secondary | ICD-10-CM

## 2020-07-21 DIAGNOSIS — Z131 Encounter for screening for diabetes mellitus: Secondary | ICD-10-CM | POA: Diagnosis not present

## 2020-07-21 DIAGNOSIS — Z0000 Encounter for general adult medical examination without abnormal findings: Secondary | ICD-10-CM

## 2020-07-21 DIAGNOSIS — Z6828 Body mass index (BMI) 28.0-28.9, adult: Secondary | ICD-10-CM | POA: Diagnosis not present

## 2020-07-21 NOTE — Patient Instructions (Signed)
Preventive Care 58-58 Years Old, Female Preventive care refers to lifestyle choices and visits with your health care provider that can promote health and wellness. This includes: A yearly physical exam. This is also called an annual wellness visit. Regular dental and eye exams. Immunizations. Screening for certain conditions. Healthy lifestyle choices, such as: Eating a healthy diet. Getting regular exercise. Not using drugs or products that contain nicotine and tobacco. Limiting alcohol use. What can I expect for my preventive care visit? Physical exam Your health care provider will check your: Height and weight. These may be used to calculate your BMI (body mass index). BMI is a measurement that tells if you are at a healthy weight. Heart rate and blood pressure. Body temperature. Skin for abnormal spots. Counseling Your health care provider may ask you questions about your: Past medical problems. Family's medical history. Alcohol, tobacco, and drug use. Emotional well-being. Home life and relationship well-being. Sexual activity. Diet, exercise, and sleep habits. Work and work Statistician. Access to firearms. Method of birth control. Menstrual cycle. Pregnancy history. What immunizations do I need?  Vaccines are usually given at various ages, according to a schedule. Your health care provider will recommend vaccines for you based on your age, medicalhistory, and lifestyle or other factors, such as travel or where you work. What tests do I need? Blood tests Lipid and cholesterol levels. These may be checked every 5 years, or more often if you are over 37 years old. Hepatitis C test. Hepatitis B test. Screening Lung cancer screening. You may have this screening every year starting at age 30 if you have a 30-pack-year history of smoking and currently smoke or have quit within the past 15 years. Colorectal cancer screening. All adults should have this screening starting at  age 23 and continuing until age 58. Your health care provider may recommend screening at age 58 if you are at increased risk. You will have tests every 1-10 years, depending on your results and the type of screening test. Diabetes screening. This is done by checking your blood sugar (glucose) after you have not eaten for a while (fasting). You may have this done every 1-3 years. Mammogram. This may be done every 1-2 years. Talk with your health care provider about when you should start having regular mammograms. This may depend on whether you have a family history of breast cancer. BRCA-related cancer screening. This may be done if you have a family history of breast, ovarian, tubal, or peritoneal cancers. Pelvic exam and Pap test. This may be done every 3 years starting at age 79. Starting at age 54, this may be done every 5 years if you have a Pap test in combination with an HPV test. Other tests STD (sexually transmitted disease) testing, if you are at risk. Bone density scan. This is done to screen for osteoporosis. You may have this scan if you are at high risk for osteoporosis. Talk with your health care provider about your test results, treatment options,and if necessary, the need for more tests. Follow these instructions at home: Eating and drinking  Eat a diet that includes fresh fruits and vegetables, whole grains, lean protein, and low-fat dairy products. Take vitamin and mineral supplements as recommended by your health care provider. Do not drink alcohol if: Your health care provider tells you not to drink. You are pregnant, may be pregnant, or are planning to become pregnant. If you drink alcohol: Limit how much you have to 0-1 drink a day. Be aware  of how much alcohol is in your drink. In the [place] one drink equals one 12 oz bottle of beer (355 mL), one 5 oz glass of wine (148 mL), or one 1 oz glass of hard liquor (44 mL).  Lifestyle Take daily care of your teeth and  gums. Brush your teeth every morning and night with fluoride toothpaste. Floss one time each day. Stay active. Exercise for at least 30 minutes 5 or more days each week. Do not use any products that contain nicotine or tobacco, such as cigarettes, e-cigarettes, and chewing tobacco. If you need help quitting, ask your health care provider. Do not use drugs. If you are sexually active, practice safe sex. Use a condom or other form of protection to prevent STIs (sexually transmitted infections). If you do not wish to become pregnant, use a form of birth control. If you plan to become pregnant, see your health care provider for a prepregnancy visit. If told by your health care provider, take low-dose aspirin daily starting at age 29. Find healthy ways to cope with stress, such as: Meditation, yoga, or listening to music. Journaling. Talking to a trusted person. Spending time with friends and family. Safety Always wear your seat belt while driving or riding in a vehicle. Do not drive: If you have been drinking alcohol. Do not ride with someone who has been drinking. When you are tired or distracted. While texting. Wear a helmet and other protective equipment during sports activities. If you have firearms in your house, make sure you follow all gun safety procedures. What's next? Visit your health care provider once a year for an annual wellness visit. Ask your health care provider how often you should have your eyes and teeth checked. Stay up to date on all vaccines. This information is not intended to replace advice given to you by your health care provider. Make sure you discuss any questions you have with your healthcare provider. Document Revised: 10/19/2019 Document Reviewed: 09/25/2017 Elsevier Patient Education  2022 [employer].

## 2020-07-21 NOTE — Progress Notes (Signed)
HPI: Catherine Monroe is a 60 y.o. female who  has a past medical history of Breast cancer ([place]) (2016), Cancer ([place]), Ear problems (60 yr old), Hypertension, and Personal history of radiation therapy.  she presents to [facility] today, 07/21/20,  for chief complaint of: Annual physical exam  Dentist: every 6 months, UTD, no big concerns Eye exam: due in April, glasses Exercise: restarting at least 4 days per week Diet: decent, fairly well balanced but room for improvement, working on it Pap smear: last year, normal Mammogram: UTD  Colon cancer screening: Cologuard 2021 COVID vaccine: Done!   Concerns: None  Past medical, surgical, social and family history reviewed:  Patient Active Problem List   Diagnosis Date Noted   Breast cancer of lower-outer quadrant of left female breast ([place]) 08/19/2014    Past Surgical History:  Procedure Laterality Date   BIOPSY BREAST  08/10/14   Left   BONE ANCHORED HEARING AID IMPLANT Right 01/16/2017   Procedure: RIGHT BONE ANCHORED HEARING AID (BAHA) IMPLANT;  Surgeon: Vicie Mutters, MD;  Location: [facility];  Service: ENT;  Laterality: Right;   BREAST BIOPSY     BREAST CYST EXCISION     age 67, pilonidal cyst   BREAST LUMPECTOMY Left 2016   BREAST LUMPECTOMY WITH NEEDLE LOCALIZATION AND AXILLARY SENTINEL LYMPH NODE BX Left 09/23/2014   Procedure: LEFT BREAST LUMPECTOMY WITH BRACKETED  NEEDLE LOCALIZATION AND LEFT AXILLARY SENTINEL LYMPH NODE BX;  Surgeon: Alphonsa Overall, MD;  Location: [facility];  Service: General;  Laterality: Left;   cyst removed     at age 23   [place]   right knee scope for torn meniscus  2/10   WISDOM TOOTH EXTRACTION      Social History   Tobacco Use   Smoking status: Former    Packs/day: 1.00    Pack years: 0.00    Types: Cigarettes    Quit date: 01/29/1987    Years since quitting: 33.4   Smokeless tobacco: Never  Substance Use Topics   Alcohol use: No     Alcohol/week: 0.0 standard drinks    Family History  Problem Relation Age of Onset   Hypertension Mother    Hypertension Father    Stroke Father    Prostate cancer Father    Sudden death Neg Hx    Hyperlipidemia Neg Hx    Heart attack Neg Hx    Diabetes Neg Hx      Current medication list and allergy/intolerance information reviewed:    Current Outpatient Medications  Medication Sig Dispense Refill   MAGNESIUM PO Take 1 capsule by mouth daily.     No current facility-administered medications for this visit.    Allergies  Allergen Reactions   Penicillins Hives and Itching      Review of Systems: Constitutional:  No  fever, no chills, No recent illness, No unintentional weight changes. No significant fatigue.  HEENT: No  headache, no vision change, no hearing change, No sore throat, No  sinus pressure Cardiac: No  chest pain, No  pressure, No palpitations, No  Orthopnea Respiratory:  No  shortness of breath. No  Cough Gastrointestinal: No  abdominal pain, No  nausea, No  vomiting,  No  blood in stool, No  diarrhea, No  constipation  Musculoskeletal: No new myalgia/arthralgia Skin: No  Rash, No other wounds/concerning lesions Genitourinary: No  incontinence, No  abnormal genital bleeding, No abnormal genital discharge Hem/Onc: No  easy bruising/bleeding, No  abnormal lymph node Endocrine: No cold intolerance,  No heat intolerance. No polyuria/polydipsia/polyphagia  Neurologic: No  weakness, No  dizziness, No  slurred speech/focal weakness/facial droop Psychiatric: No  concerns with depression, No  concerns with anxiety, No sleep problems, No mood problems  Exam:  BP 131/82   Pulse 72   Temp 98.1 F (36.7 C)   Ht 5' 1.5" (1.562 m)   Wt 152 lb (68.9 kg)   LMP 08/31/2011   SpO2 96%   BMI 28.26 kg/m  Constitutional: VS see above. General Appearance: alert, well-developed, well-nourished, NAD Eyes: Normal lids and conjunctive, non-icteric sclera Ears, Nose, Mouth,  Throat: MMM, Normal external inspection ears/nares/mouth/lips/gums. TM normal bilaterally.  Neck: No masses, trachea midline. No thyroid enlargement. No tenderness/mass appreciated. No lymphadenopathy Respiratory: Normal respiratory effort. no wheeze, no rhonchi, no rales Cardiovascular: S1/S2 normal, no murmur, no rub/gallop auscultated. RRR. No lower extremity edema. Pedal pulse II/IV bilaterally DP and PT. No carotid bruit or JVD. No abdominal aortic bruit. Gastrointestinal: Nontender, no masses. No hepatomegaly, no splenomegaly. No hernia appreciated. Bowel sounds normal. Rectal exam deferred.  Musculoskeletal: Gait normal. No clubbing/cyanosis of digits.  Neurological: Normal balance/coordination. No tremor. No cranial nerve deficit on limited exam. Motor and sensation intact and symmetric. Cerebellar reflexes intact.  Skin: warm, dry, intact. No rash/ulcer. No concerning nevi or subq nodules on limited exam.   Psychiatric: Normal judgment/insight. Normal mood and affect. Oriented x3.    No results found for this or any previous visit (from the past 72 hour(s)).  No results found.   ASSESSMENT/PLAN:   1. Annual physical exam Checking CBC with differential, CMP, lipid panel today.  She is up-to-date on other preventative care. - CBC with Differential/Platelet - COMPLETE METABOLIC PANEL WITH GFR - Lipid panel  2. Thyroid disorder screen Checking TSH today. - TSH  3. Diabetes mellitus screening 4. BMI 28.0-28.9,adult Checking hemoglobin A1c today. - Hemoglobin A1c  Orders Placed This Encounter  Procedures   CBC with Differential/Platelet   COMPLETE METABOLIC PANEL WITH GFR   Lipid panel   TSH   Hemoglobin A1c    No orders of the defined types were placed in this encounter.   Patient Instructions  Preventive Care 42-58 Years Old, Female Preventive care refers to lifestyle choices and visits with your health care provider that can promote health and wellness. This  includes: A yearly physical exam. This is also called an annual wellness visit. Regular dental and eye exams. Immunizations. Screening for certain conditions. Healthy lifestyle choices, such as: Eating a healthy diet. Getting regular exercise. Not using drugs or products that contain nicotine and tobacco. Limiting alcohol use. What can I expect for my preventive care visit? Physical exam Your health care provider will check your: Height and weight. These may be used to calculate your BMI (body mass index). BMI is a measurement that tells if you are at a healthy weight. Heart rate and blood pressure. Body temperature. Skin for abnormal spots. Counseling Your health care provider may ask you questions about your: Past medical problems. Family's medical history. Alcohol, tobacco, and drug use. Emotional well-being. Home life and relationship well-being. Sexual activity. Diet, exercise, and sleep habits. Work and work Statistician. Access to firearms. Method of birth control. Menstrual cycle. Pregnancy history. What immunizations do I need?  Vaccines are usually given at various ages, according to a schedule. Your health care provider will recommend vaccines for you based on your age, medicalhistory, and lifestyle or other factors,  such as travel or where you work. What tests do I need? Blood tests Lipid and cholesterol levels. These may be checked every 5 years, or more often if you are over 46 years old. Hepatitis C test. Hepatitis B test. Screening Lung cancer screening. You may have this screening every year starting at age 63 if you have a 30-pack-year history of smoking and currently smoke or have quit within the past 15 years. Colorectal cancer screening. All adults should have this screening starting at age 68 and continuing until age 28. Your health care provider may recommend screening at age 50 if you are at increased risk. You will have tests every 1-10 years,  depending on your results and the type of screening test. Diabetes screening. This is done by checking your blood sugar (glucose) after you have not eaten for a while (fasting). You may have this done every 1-3 years. Mammogram. This may be done every 1-2 years. Talk with your health care provider about when you should start having regular mammograms. This may depend on whether you have a family history of breast cancer. BRCA-related cancer screening. This may be done if you have a family history of breast, ovarian, tubal, or peritoneal cancers. Pelvic exam and Pap test. This may be done every 3 years starting at age 4. Starting at age 81, this may be done every 5 years if you have a Pap test in combination with an HPV test. Other tests STD (sexually transmitted disease) testing, if you are at risk. Bone density scan. This is done to screen for osteoporosis. You may have this scan if you are at high risk for osteoporosis. Talk with your health care provider about your test results, treatment options,and if necessary, the need for more tests. Follow these instructions at home: Eating and drinking  Eat a diet that includes fresh fruits and vegetables, whole grains, lean protein, and low-fat dairy products. Take vitamin and mineral supplements as recommended by your health care provider. Do not drink alcohol if: Your health care provider tells you not to drink. You are pregnant, may be pregnant, or are planning to become pregnant. If you drink alcohol: Limit how much you have to 0-1 drink a day. Be aware of how much alcohol is in your drink. In the [place] one drink equals one 12 oz bottle of beer (355 mL), one 5 oz glass of wine (148 mL), or one 1 oz glass of hard liquor (44 mL).  Lifestyle Take daily care of your teeth and gums. Brush your teeth every morning and night with fluoride toothpaste. Floss one time each day. Stay active. Exercise for at least 30 minutes 5 or more days each  week. Do not use any products that contain nicotine or tobacco, such as cigarettes, e-cigarettes, and chewing tobacco. If you need help quitting, ask your health care provider. Do not use drugs. If you are sexually active, practice safe sex. Use a condom or other form of protection to prevent STIs (sexually transmitted infections). If you do not wish to become pregnant, use a form of birth control. If you plan to become pregnant, see your health care provider for a prepregnancy visit. If told by your health care provider, take low-dose aspirin daily starting at age 39. Find healthy ways to cope with stress, such as: Meditation, yoga, or listening to music. Journaling. Talking to a trusted person. Spending time with friends and family. Safety Always wear your seat belt while driving or riding in a vehicle.  Do not drive: If you have been drinking alcohol. Do not ride with someone who has been drinking. When you are tired or distracted. While texting. Wear a helmet and other protective equipment during sports activities. If you have firearms in your house, make sure you follow all gun safety procedures. What's next? Visit your health care provider once a year for an annual wellness visit. Ask your health care provider how often you should have your eyes and teeth checked. Stay up to date on all vaccines. This information is not intended to replace advice given to you by your health care provider. Make sure you discuss any questions you have with your healthcare provider. Document Revised: 10/19/2019 Document Reviewed: 09/25/2017 Elsevier Patient Education  2022 [employer].  Follow-up plan: Return in about 1 year (around 07/21/2021) for annual physical exam.  Clearnce Sorrel, DNP, APRN, FNP-BC [facility]

## 2020-07-22 LAB — CBC WITH DIFFERENTIAL/PLATELET
Absolute Monocytes: 319 cells/uL (ref 200–950)
Basophils Absolute: 101 cells/uL (ref 0–200)
Basophils Relative: 2.4 %
Eosinophils Absolute: 122 cells/uL (ref 15–500)
Eosinophils Relative: 2.9 %
HCT: 43.1 % (ref 35.0–45.0)
Hemoglobin: 13.9 g/dL (ref 11.7–15.5)
Lymphs Abs: 1176 cells/uL (ref 850–3900)
MCH: 28.7 pg (ref 27.0–33.0)
MCHC: 32.3 g/dL (ref 32.0–36.0)
MCV: 88.9 fL (ref 80.0–100.0)
MPV: 11.2 fL (ref 7.5–12.5)
Monocytes Relative: 7.6 %
Neutro Abs: 2482 cells/uL (ref 1500–7800)
Neutrophils Relative %: 59.1 %
Platelets: 229 10*3/uL (ref 140–400)
RBC: 4.85 10*6/uL (ref 3.80–5.10)
RDW: 12.6 % (ref 11.0–15.0)
Total Lymphocyte: 28 %
WBC: 4.2 10*3/uL (ref 3.8–10.8)

## 2020-07-22 LAB — COMPLETE METABOLIC PANEL WITH GFR
AG Ratio: 1.8 (calc) (ref 1.0–2.5)
ALT: 47 U/L — ABNORMAL HIGH (ref 6–29)
AST: 44 U/L — ABNORMAL HIGH (ref 10–35)
Albumin: 4.4 g/dL (ref 3.6–5.1)
Alkaline phosphatase (APISO): 88 U/L (ref 37–153)
BUN: 17 mg/dL (ref 7–25)
CO2: 30 mmol/L (ref 20–32)
Calcium: 9.2 mg/dL (ref 8.6–10.4)
Chloride: 105 mmol/L (ref 98–110)
Creat: 0.66 mg/dL (ref 0.50–1.05)
GFR, Est African American: 114 mL/min/{1.73_m2} (ref >=60)
GFR, Est Non African American: 98 mL/min/{1.73_m2} (ref >=60)
Globulin: 2.4 g/dL (calc) (ref 1.9–3.7)
Glucose, Bld: 98 mg/dL (ref 65–99)
Potassium: 4.1 mmol/L (ref 3.5–5.3)
Sodium: 142 mmol/L (ref 135–146)
Total Bilirubin: 0.5 mg/dL (ref 0.2–1.2)
Total Protein: 6.8 g/dL (ref 6.1–8.1)

## 2020-07-22 LAB — HEMOGLOBIN A1C
Hgb A1c MFr Bld: 5.2 % of total Hgb (ref <5.7)
Mean Plasma Glucose: 103 mg/dL
eAG (mmol/L): 5.7 mmol/L

## 2020-07-22 LAB — LIPID PANEL
Cholesterol: 214 mg/dL — ABNORMAL HIGH (ref <200)
HDL: 64 mg/dL (ref >=50)
LDL Cholesterol (Calc): 134 mg/dL (calc) — ABNORMAL HIGH
Non-HDL Cholesterol (Calc): 150 mg/dL (calc) — ABNORMAL HIGH (ref <130)
Total CHOL/HDL Ratio: 3.3 (calc) (ref <5.0)
Triglycerides: 72 mg/dL (ref <150)

## 2020-07-22 LAB — TSH: TSH: 1.45 mIU/L (ref 0.40–4.50)

## 2020-07-30 ENCOUNTER — Encounter: Payer: Self-pay | Admitting: Family Medicine

## 2020-07-30 DIAGNOSIS — Z0184 Encounter for antibody response examination: Secondary | ICD-10-CM

## 2020-08-04 DIAGNOSIS — Z0184 Encounter for antibody response examination: Secondary | ICD-10-CM | POA: Diagnosis not present

## 2020-08-10 LAB — POLIOVIRUS (1,3) ABS, NEUTRALIZ.
Polio 1 Titer: >1:128 {titer}
Polio 3 Titer: 1:64 {titer}

## 2020-08-25 ENCOUNTER — Other Ambulatory Visit: Payer: Self-pay | Admitting: Family Medicine

## 2020-08-25 DIAGNOSIS — Z1231 Encounter for screening mammogram for malignant neoplasm of breast: Secondary | ICD-10-CM

## 2020-10-20 ENCOUNTER — Other Ambulatory Visit: Payer: Self-pay

## 2020-10-20 ENCOUNTER — Ambulatory Visit
Admission: RE | Admit: 2020-10-20 | Discharge: 2020-10-20 | Disposition: A | Discharge status: Home / Self Care | Payer: 59 | Source: Ambulatory Visit | Attending: Family Medicine | Admitting: Family Medicine

## 2020-10-20 DIAGNOSIS — H9011 Conductive hearing loss, unilateral, right ear, with unrestricted hearing on the contralateral side: Secondary | ICD-10-CM | POA: Diagnosis not present

## 2020-10-20 DIAGNOSIS — Z9621 Cochlear implant status: Secondary | ICD-10-CM | POA: Diagnosis not present

## 2020-10-20 DIAGNOSIS — Z1231 Encounter for screening mammogram for malignant neoplasm of breast: Secondary | ICD-10-CM

## 2020-10-20 DIAGNOSIS — Z9089 Acquired absence of other organs: Secondary | ICD-10-CM | POA: Diagnosis not present

## 2020-10-20 DIAGNOSIS — H95191 Other disorders following mastoidectomy, right ear: Secondary | ICD-10-CM | POA: Diagnosis not present

## 2020-10-20 DIAGNOSIS — H9312 Tinnitus, left ear: Secondary | ICD-10-CM | POA: Diagnosis not present

## 2020-10-27 NOTE — Progress Notes (Signed)
Please call patient. Normal mammogram.  Repeat in 1 year.  

## 2021-04-06 ENCOUNTER — Encounter: Payer: Self-pay | Admitting: Family Medicine

## 2021-09-18 ENCOUNTER — Other Ambulatory Visit: Payer: Self-pay | Admitting: Family Medicine

## 2021-09-18 DIAGNOSIS — Z1231 Encounter for screening mammogram for malignant neoplasm of breast: Secondary | ICD-10-CM

## 2021-10-04 ENCOUNTER — Encounter: Payer: Self-pay | Admitting: Family Medicine

## 2021-11-13 ENCOUNTER — Ambulatory Visit: Payer: 59

## 2021-12-04 ENCOUNTER — Ambulatory Visit
Admission: RE | Admit: 2021-12-04 | Discharge: 2021-12-04 | Disposition: A | Discharge status: Home / Self Care | Payer: Self-pay | Source: Ambulatory Visit | Attending: Family Medicine | Admitting: Family Medicine

## 2021-12-04 DIAGNOSIS — Z1231 Encounter for screening mammogram for malignant neoplasm of breast: Secondary | ICD-10-CM

## 2022-07-19 ENCOUNTER — Encounter: Payer: Self-pay | Admitting: Family Medicine

## 2022-07-26 ENCOUNTER — Encounter: Payer: Self-pay | Admitting: Family Medicine

## 2022-08-16 ENCOUNTER — Encounter: Payer: Self-pay | Admitting: Family Medicine

## 2022-08-16 ENCOUNTER — Ambulatory Visit (INDEPENDENT_AMBULATORY_CARE_PROVIDER_SITE_OTHER): Payer: Commercial Managed Care - PPO | Admitting: Family Medicine

## 2022-08-16 VITALS — BP 134/79 | HR 85 | Ht 61.0 in | Wt 105.0 lb

## 2022-08-16 DIAGNOSIS — Z0000 Encounter for general adult medical examination without abnormal findings: Secondary | ICD-10-CM

## 2022-08-16 DIAGNOSIS — Z23 Encounter for immunization: Secondary | ICD-10-CM

## 2022-08-16 DIAGNOSIS — Z1211 Encounter for screening for malignant neoplasm of colon: Secondary | ICD-10-CM

## 2022-08-16 NOTE — Progress Notes (Signed)
Complete physical exam  Patient: Catherine Monroe   DOB: 08-28-1960   62 y.o. Female  MRN: [id number]  Subjective:    Chief Complaint  Patient presents with   Annual Exam    Catherine Monroe is a 62 y.o. female who presents today for a complete physical exam. She reports consuming a general healthy diet. Gym/ health club routine includes cardio and light weights. She generally feels well. She reports sleeping fairly well. She does not have additional problems to discuss today.  Using trazodone for sleep, and working well.   Most recent fall risk assessment:    08/16/2022    1:24 PM  Fall Risk   Falls in the past year? 0  Number falls in past yr: 0  Injury with Fall? 0  Risk for fall due to : No Fall Risks  Follow up Falls evaluation completed     Most recent depression screenings:    08/16/2022    1:24 PM 07/21/2020    9:37 AM  PHQ 2/9 Scores  PHQ - 2 Score 0 0    {VISON DENTAL STD PSA (Optional):27386}  {History (Optional):23778}  Patient Care Team: Agapito Games, MD as PCP - General Lurline Hare, MD as Consulting Physician (Radiation Oncology) Serena Croissant, MD as Consulting Physician (Hematology and Oncology) Ovidio Kin, MD as Consulting Physician (General Surgery) Salomon Fick, NP as Nurse Practitioner (Hematology and Oncology) Janalyn Harder, MD (Inactive) as Consulting Physician (Dermatology)   Outpatient Medications Prior to Visit  Medication Sig   MAGNESIUM PO Take 1 capsule by mouth daily.   No facility-administered medications prior to visit.    ROS        Objective:     BP 134/79   Pulse 85   Ht 5\' 1"  (1.549 m)   Wt 105 lb (47.6 kg)   LMP 08/31/2011   SpO2 99%   BMI 19.84 kg/m  {Vitals History (Optional):23777}  Physical Exam Vitals reviewed. Exam conducted with a chaperone present.  Constitutional:      Appearance: Normal appearance. She is well-developed.  HENT:     Head: Normocephalic and atraumatic.      Right Ear: External ear normal.     Left Ear: Tympanic membrane, ear canal and external ear normal.     Ears:     Comments: Right TM is thickened  landmarks are absent.     Nose: Nose normal.  Eyes:     Conjunctiva/sclera: Conjunctivae normal.     Pupils: Pupils are equal, round, and reactive to light.  Neck:     Thyroid: No thyromegaly.  Cardiovascular:     Rate and Rhythm: Normal rate and regular rhythm.     Heart sounds: Normal heart sounds.  Pulmonary:     Effort: Pulmonary effort is normal.     Breath sounds: Normal breath sounds. No wheezing.  Chest:     Chest wall: No mass.  Breasts:    Right: Normal.     Left: Normal.  Abdominal:     General: Bowel sounds are normal.     Palpations: Abdomen is soft.  Musculoskeletal:     Cervical back: Neck supple.  Lymphadenopathy:     Cervical: No cervical adenopathy.     Upper Body:     Right upper body: No axillary adenopathy.     Left upper body: No axillary adenopathy.  Skin:    General: Skin is warm and dry.     Coloration: Skin is not pale.  Neurological:     Mental Status: She is alert and oriented to person, place, and time.  Psychiatric:        Behavior: Behavior normal.      No results found for any visits on 08/16/22. {Show previous labs (optional):23779}    Assessment & Plan:    Routine Health Maintenance and Physical Exam  Immunization History  Administered Date(s) Administered   Hepatitis B 04/28/1988, 10/02/1988, 01/01/1989   Influenza Whole 12/19/2008   Influenza,inj,Quad PF,6+ Mos 11/11/2012, 10/06/2015, 10/01/2018   Influenza-Unspecified 10/31/2016, 10/28/2017   MMR 08/19/2005   PFIZER(Purple Top)SARS-COV-2 Vaccination 02/10/2019, 03/01/2019, 11/17/2019   Tdap 03/20/2011   Zoster Recombinant(Shingrix) 08/16/2022    Health Maintenance  Topic Date Due   Pneumococcal Vaccine 58-60 Years old (1 of 2 - PCV) Never done   DTaP/Tdap/Td (2 - Td or Tdap) 03/19/2021   COVID-19 Vaccine (4 - 2023-24  season) 09/28/2021   Fecal DNA (Cologuard)  08/12/2022   INFLUENZA VACCINE  08/29/2022   Zoster Vaccines- Shingrix (2 of 2) 10/11/2022   MAMMOGRAM  12/05/2023   PAP SMEAR-Modifier  07/19/2024   Hepatitis C Screening  Completed   HIV Screening  Completed   HPV VACCINES  Aged Out    Discussed health benefits of physical activity, and encouraged her to engage in regular exercise appropriate for her age and condition.  Problem List Items Addressed This Visit   None Visit Diagnoses     Wellness examination    -  Primary   Relevant Orders   CMP14+EGFR   Direct LDL   CBC with Differential   TSH   Encounter for screening colonoscopy       Relevant Orders   Cologuard   Encounter for immunization       Relevant Orders   Varicella-zoster vaccine IM (Completed)       Keep up a regular exercise program and make sure you are eating a healthy diet Try to eat 4 servings of dairy a day, or if you are lactose intolerant take a calcium with vitamin D daily.  Your vaccines are up to date.   Return in about 1 year (around 08/16/2023) for Wellness Exam.     Nani Gasser, MD

## 2022-08-17 ENCOUNTER — Encounter: Payer: Self-pay | Admitting: Family Medicine

## 2022-08-19 DIAGNOSIS — Z0000 Encounter for general adult medical examination without abnormal findings: Secondary | ICD-10-CM | POA: Diagnosis not present

## 2022-08-20 LAB — CBC WITH DIFFERENTIAL/PLATELET
Basophils Absolute: 0.1 10*3/uL (ref 0.0–0.2)
Basos: 2 %
EOS (ABSOLUTE): 0.1 10*3/uL (ref 0.0–0.4)
Eos: 3 %
Hematocrit: 39.4 % (ref 34.0–46.6)
Hemoglobin: 13 g/dL (ref 11.1–15.9)
Immature Grans (Abs): 0 10*3/uL (ref 0.0–0.1)
Immature Granulocytes: 0 %
Lymphocytes Absolute: 1.2 10*3/uL (ref 0.7–3.1)
Lymphs: 26 %
MCH: 29.5 pg (ref 26.6–33.0)
MCHC: 33 g/dL (ref 31.5–35.7)
MCV: 89 fL (ref 79–97)
Monocytes Absolute: 0.4 10*3/uL (ref 0.1–0.9)
Monocytes: 10 %
Neutrophils Absolute: 2.6 10*3/uL (ref 1.4–7.0)
Neutrophils: 59 %
Platelets: 160 10*3/uL (ref 150–450)
RBC: 4.41 x10E6/uL (ref 3.77–5.28)
RDW: 12.1 % (ref 11.7–15.4)
WBC: 4.4 10*3/uL (ref 3.4–10.8)

## 2022-08-20 LAB — CMP14+EGFR
ALT: 30 IU/L (ref 0–32)
AST: 27 IU/L (ref 0–40)
Albumin: 4.5 g/dL (ref 3.8–4.9)
Alkaline Phosphatase: 89 IU/L (ref 44–121)
BUN/Creatinine Ratio: 21 (ref 12–28)
BUN: 13 mg/dL (ref 8–27)
Bilirubin Total: 0.6 mg/dL (ref 0.0–1.2)
CO2: 23 mmol/L (ref 20–29)
Calcium: 9.1 mg/dL (ref 8.7–10.3)
Chloride: 98 mmol/L (ref 96–106)
Creatinine, Ser: 0.63 mg/dL (ref 0.57–1.00)
Globulin, Total: 2 g/dL (ref 1.5–4.5)
Glucose: 93 mg/dL (ref 70–99)
Potassium: 4.1 mmol/L (ref 3.5–5.2)
Sodium: 137 mmol/L (ref 134–144)
Total Protein: 6.5 g/dL (ref 6.0–8.5)
eGFR: 101 mL/min/{1.73_m2} (ref >59)

## 2022-08-20 LAB — TSH: TSH: 1.5 u[IU]/mL (ref 0.450–4.500)

## 2022-08-20 NOTE — Progress Notes (Signed)
Catherine Monroe, metabolic panel looks great and liver function is back to normal.  Great CBC no anemia.  Thyroid looks perfect.

## 2022-09-08 DIAGNOSIS — Z1211 Encounter for screening for malignant neoplasm of colon: Secondary | ICD-10-CM | POA: Diagnosis not present

## 2022-09-16 NOTE — Progress Notes (Signed)
Great news! Your Cologuard test is negative.  Recommend repeat colon cancer screening in 3 years.

## 2022-10-18 ENCOUNTER — Other Ambulatory Visit: Payer: Self-pay | Admitting: Family Medicine

## 2022-10-18 DIAGNOSIS — Z1231 Encounter for screening mammogram for malignant neoplasm of breast: Secondary | ICD-10-CM

## 2022-12-06 ENCOUNTER — Ambulatory Visit
Admission: RE | Admit: 2022-12-06 | Discharge: 2022-12-06 | Disposition: A | Discharge status: Home / Self Care | Payer: Commercial Managed Care - PPO | Source: Ambulatory Visit | Attending: Family Medicine | Admitting: Family Medicine

## 2022-12-06 DIAGNOSIS — Z1231 Encounter for screening mammogram for malignant neoplasm of breast: Secondary | ICD-10-CM | POA: Diagnosis not present

## 2022-12-10 NOTE — Progress Notes (Signed)
Please call patient. Normal mammogram.  Repeat in 1 year.  

## 2022-12-13 DIAGNOSIS — Z9621 Cochlear implant status: Secondary | ICD-10-CM | POA: Diagnosis not present

## 2022-12-13 DIAGNOSIS — H9011 Conductive hearing loss, unilateral, right ear, with unrestricted hearing on the contralateral side: Secondary | ICD-10-CM | POA: Diagnosis not present

## 2022-12-13 DIAGNOSIS — Z9089 Acquired absence of other organs: Secondary | ICD-10-CM | POA: Diagnosis not present

## 2022-12-13 DIAGNOSIS — H9312 Tinnitus, left ear: Secondary | ICD-10-CM | POA: Diagnosis not present

## 2023-02-21 ENCOUNTER — Ambulatory Visit (INDEPENDENT_AMBULATORY_CARE_PROVIDER_SITE_OTHER): Payer: 59 | Admitting: Family Medicine

## 2023-02-21 DIAGNOSIS — Z23 Encounter for immunization: Secondary | ICD-10-CM | POA: Diagnosis not present

## 2023-02-21 NOTE — Progress Notes (Signed)
HPI  Pt here today to get her 2nd Shingles vaccine.                        Assessment and Plan:  Pt given shingles in LD. Tolerated well no redness or swelling noted. Pt is to f/u with Dr. Linford Arnold as needed.

## 2023-02-21 NOTE — Progress Notes (Signed)
Agree with documentation as above.   Nani Gasser, MD

## 2023-06-30 ENCOUNTER — Encounter: Payer: Self-pay | Admitting: Family Medicine

## 2023-07-01 ENCOUNTER — Other Ambulatory Visit (HOSPITAL_COMMUNITY): Payer: Self-pay

## 2023-07-01 ENCOUNTER — Other Ambulatory Visit: Payer: Self-pay | Admitting: *Deleted

## 2023-07-01 MED ORDER — TRAZODONE HCL 50 MG PO TABS
50.0000 mg | ORAL_TABLET | Freq: Every day | ORAL | 0 refills | Status: DC
  Filled 2023-07-01: qty 135, 67d supply, fill #0

## 2023-07-04 ENCOUNTER — Other Ambulatory Visit (HOSPITAL_COMMUNITY): Payer: Self-pay

## 2023-08-04 ENCOUNTER — Other Ambulatory Visit (HOSPITAL_COMMUNITY): Payer: Self-pay

## 2023-08-04 ENCOUNTER — Other Ambulatory Visit: Payer: Self-pay

## 2023-08-04 ENCOUNTER — Telehealth: Payer: Self-pay | Admitting: Family Medicine

## 2023-08-04 MED ORDER — BUPROPION HCL ER (XL) 150 MG PO TB24
150.0000 mg | ORAL_TABLET | Freq: Every day | ORAL | 1 refills | Status: DC
  Filled 2023-08-04: qty 90, 90d supply, fill #0

## 2023-08-04 NOTE — Telephone Encounter (Signed)
 Called and would like to start medication for mood, stress.   Meds ordered this encounter  Medications   buPROPion  (WELLBUTRIN  XL) 150 MG 24 hr tablet    Sig: Take 1 tablet (150 mg total) by mouth daily.    Dispense:  90 tablet    Refill:  1    Pls mail to patient

## 2023-08-21 ENCOUNTER — Ambulatory Visit (INDEPENDENT_AMBULATORY_CARE_PROVIDER_SITE_OTHER): Admitting: Family Medicine

## 2023-08-21 ENCOUNTER — Encounter: Payer: Self-pay | Admitting: Family Medicine

## 2023-08-21 VITALS — BP 136/82 | HR 79 | Ht 61.0 in | Wt 103.7 lb

## 2023-08-21 DIAGNOSIS — Z0000 Encounter for general adult medical examination without abnormal findings: Secondary | ICD-10-CM

## 2023-08-21 NOTE — Progress Notes (Signed)
 Complete physical exam  Patient: Catherine Monroe   DOB: 11-21-1960   63 y.o. Female  MRN: [id number]  Subjective:    Chief Complaint  Patient presents with   Annual Exam    Catherine Monroe is a 63 y.o. female who presents today for a complete physical exam. She reports consuming a general diet. Works out regularly.   She generally feels well. She reports sleeping fair - uses trazodone - helps her fall asleep . She does not have additional problems to discuss today.   Work has been stressful. Doing ok on recent wellbutrin  start.  A little dizzy the first few days and now tolerated well.  She says she is not quite sure if it has been helpful or not.  She does have a meeting with management on Friday to discuss some of her work concerns.  Most recent fall risk assessment:    08/21/2023    4:48 PM  Fall Risk   Falls in the past year? 0  Number falls in past yr: 0  Injury with Fall? 0  Risk for fall due to : No Fall Risks  Follow up Falls evaluation completed     Most recent depression screenings:    08/21/2023    4:48 PM 08/16/2022    1:24 PM  PHQ 2/9 Scores  PHQ - 2 Score 3 0  PHQ- 9 Score 7          Patient Care Team: Alvan Dorothyann BIRCH, MD as PCP - General Keenan Hastings, MD as Consulting Physician (Radiation Oncology) Odean Potts, MD as Consulting Physician (Hematology and Oncology) Ethyl Lenis, MD as Consulting Physician (General Surgery) Moses Powell Hummer, NP as Nurse Practitioner (Hematology and Oncology) Livingston Rigg, MD (Inactive) as Consulting Physician (Dermatology)   Outpatient Medications Prior to Visit  Medication Sig   buPROPion  (WELLBUTRIN  XL) 150 MG 24 hr tablet Take 1 tablet (150 mg total) by mouth daily.   MAGNESIUM PO Take 1 capsule by mouth daily.   traZODone  (DESYREL ) 50 MG tablet Take 1-2 tablets (50-100 mg total) by mouth at bedtime.   No facility-administered medications prior to visit.    ROS        Objective:      BP 136/82   Pulse 79   Ht 5' 1 (1.549 m)   Wt 103 lb 11.2 oz (47 kg)   LMP 08/31/2011   SpO2 96%   BMI 19.59 kg/m     Physical Exam Exam conducted with a chaperone present.  Constitutional:      Appearance: Normal appearance.  HENT:     Head: Normocephalic and atraumatic.     Right Ear: Tympanic membrane, ear canal and external ear normal.     Left Ear: Tympanic membrane, ear canal and external ear normal.     Nose: Nose normal.     Mouth/Throat:     Pharynx: Oropharynx is clear.  Eyes:     Extraocular Movements: Extraocular movements intact.     Conjunctiva/sclera: Conjunctivae normal.     Pupils: Pupils are equal, round, and reactive to light.  Neck:     Thyroid: No thyromegaly.  Cardiovascular:     Rate and Rhythm: Normal rate and regular rhythm.  Pulmonary:     Effort: Pulmonary effort is normal.     Breath sounds: Normal breath sounds.  Chest:     Chest wall: No mass.  Breasts:    Right: Normal. No mass, nipple discharge or skin change.  Left: Normal. No mass, nipple discharge or skin change.     Comments: Old scar on the left breast in the outer lower quadrant.  Well-healed. Abdominal:     General: Bowel sounds are normal.     Palpations: Abdomen is soft.     Tenderness: There is no abdominal tenderness.  Musculoskeletal:        General: No swelling.     Cervical back: Neck supple.  Lymphadenopathy:     Upper Body:     Right upper body: No supraclavicular, axillary or pectoral adenopathy.     Left upper body: No supraclavicular, axillary or pectoral adenopathy.  Skin:    General: Skin is warm and dry.  Neurological:     Mental Status: She is oriented to person, place, and time.  Psychiatric:        Mood and Affect: Mood normal.        Behavior: Behavior normal.      No results found for any visits on 08/21/23.      Assessment & Plan:    Routine Health Maintenance and Physical Exam  Immunization History  Administered Date(s) Administered    Hepatitis B 04/28/1988, 10/02/1988, 01/01/1989   Influenza Whole 12/19/2008   Influenza,inj,Quad PF,6+ Mos 11/11/2012, 10/06/2015, 10/01/2018   Influenza-Unspecified 10/31/2016, 10/28/2017, 10/20/2020, 11/13/2021   MMR 08/19/2005   PFIZER(Purple Top)SARS-COV-2 Vaccination 02/10/2019, 03/01/2019, 11/17/2019   Tdap 03/20/2011, 05/24/2020   Zoster Recombinant(Shingrix ) 08/16/2022, 02/21/2023    Health Maintenance  Topic Date Due   Pneumococcal Vaccine 57-45 Years old (1 of 2 - PCV) Never done   COVID-19 Vaccine (4 - 2024-25 season) 09/29/2022   INFLUENZA VACCINE  08/29/2023   Cervical Cancer Screening (HPV/Pap Cotest)  07/19/2024   MAMMOGRAM  12/05/2024   Fecal DNA (Cologuard)  09/07/2025   DTaP/Tdap/Td (3 - Td or Tdap) 05/25/2030   Hepatitis B Vaccines  Completed   Hepatitis C Screening  Completed   HIV Screening  Completed   Zoster Vaccines- Shingrix   Completed   HPV VACCINES  Aged Out   Meningococcal B Vaccine  Aged Out    Discussed health benefits of physical activity, and encouraged her to engage in regular exercise appropriate for her age and condition.  Problem List Items Addressed This Visit   None Visit Diagnoses       Wellness examination    -  Primary   Relevant Orders   TSH   Lipid Panel With LDL/HDL Ratio   CMP14+EGFR   Direct LDL   Hemoglobin A1c       Keep up a regular exercise program and make sure you are eating a healthy diet Try to eat 4 servings of dairy a day, or if you are lactose intolerant take a calcium with vitamin D daily.  Your vaccines are up to date.   Up-to-date on screenings.   She will get labs done in the next few days.  Return in about 6 months (around 02/21/2024) for Mood, sleep.     Dorothyann Byars, MD

## 2023-08-22 ENCOUNTER — Encounter: Payer: Commercial Managed Care - PPO | Admitting: Family Medicine

## 2023-08-23 LAB — CMP14+EGFR
ALT: 29 IU/L (ref 0–32)
AST: 26 IU/L (ref 0–40)
Albumin: 4.7 g/dL (ref 3.9–4.9)
Alkaline Phosphatase: 94 IU/L (ref 44–121)
BUN/Creatinine Ratio: 19 (ref 12–28)
BUN: 13 mg/dL (ref 8–27)
Bilirubin Total: 0.6 mg/dL (ref 0.0–1.2)
CO2: 24 mmol/L (ref 20–29)
Calcium: 9.1 mg/dL (ref 8.7–10.3)
Chloride: 97 mmol/L (ref 96–106)
Creatinine, Ser: 0.69 mg/dL (ref 0.57–1.00)
Globulin, Total: 2.1 g/dL (ref 1.5–4.5)
Glucose: 103 mg/dL — ABNORMAL HIGH (ref 70–99)
Potassium: 4 mmol/L (ref 3.5–5.2)
Sodium: 138 mmol/L (ref 134–144)
Total Protein: 6.8 g/dL (ref 6.0–8.5)
eGFR: 99 mL/min/1.73 (ref >59)

## 2023-08-23 LAB — LDL CHOLESTEROL, DIRECT: LDL Direct: 116 mg/dL — ABNORMAL HIGH (ref 0–99)

## 2023-08-23 LAB — HEMOGLOBIN A1C
Est. average glucose Bld gHb Est-mCnc: 108 mg/dL
Hgb A1c MFr Bld: 5.4 % (ref 4.8–5.6)

## 2023-08-23 LAB — LIPID PANEL WITH LDL/HDL RATIO
Cholesterol, Total: 201 mg/dL — ABNORMAL HIGH (ref 100–199)
HDL: 84 mg/dL (ref >39)
LDL Chol Calc (NIH): 104 mg/dL — ABNORMAL HIGH (ref 0–99)
LDL/HDL Ratio: 1.2 ratio (ref 0.0–3.2)
Triglycerides: 71 mg/dL (ref 0–149)
VLDL Cholesterol Cal: 13 mg/dL (ref 5–40)

## 2023-08-23 LAB — TSH: TSH: 1.65 u[IU]/mL (ref 0.450–4.500)

## 2023-08-25 ENCOUNTER — Ambulatory Visit: Payer: Self-pay | Admitting: Family Medicine

## 2023-08-25 NOTE — Progress Notes (Signed)
 Hi Catherine Monroe, total cholesterol and LDL just slightly elevated, but overall looks ok.  You have wonderful HDL from working out!  Metabolic panel overall looks good glucose was up a little I do not remember if you were completely fasting that day.  Liver function looks great this time.  Thyroid level looks great.  A1c normal no sign of diabetes or prediabetes.  The 10-year ASCVD risk score (Arnett DK, et al., 2019) is: 6.5%   Values used to calculate the score:     Age: 64 years     Clincally relevant sex: Female     Is Non-Hispanic African American: No     Diabetic: No     Tobacco smoker: Yes     Systolic Blood Pressure: 136 mmHg     Is BP treated: No     HDL Cholesterol: 84 mg/dL     Total Cholesterol: 201 mg/dL

## 2023-09-12 DIAGNOSIS — Z9621 Cochlear implant status: Secondary | ICD-10-CM | POA: Diagnosis not present

## 2023-09-12 DIAGNOSIS — S0005XD Superficial foreign body of scalp, subsequent encounter: Secondary | ICD-10-CM | POA: Diagnosis not present

## 2023-09-12 DIAGNOSIS — Z9089 Acquired absence of other organs: Secondary | ICD-10-CM | POA: Diagnosis not present

## 2023-09-12 DIAGNOSIS — S0005XA Superficial foreign body of scalp, initial encounter: Secondary | ICD-10-CM | POA: Diagnosis not present

## 2023-09-12 DIAGNOSIS — H9312 Tinnitus, left ear: Secondary | ICD-10-CM | POA: Diagnosis not present

## 2023-09-12 DIAGNOSIS — H9211 Otorrhea, right ear: Secondary | ICD-10-CM | POA: Diagnosis not present

## 2023-09-12 DIAGNOSIS — H7011 Chronic mastoiditis, right ear: Secondary | ICD-10-CM | POA: Diagnosis not present

## 2023-09-12 DIAGNOSIS — H9011 Conductive hearing loss, unilateral, right ear, with unrestricted hearing on the contralateral side: Secondary | ICD-10-CM | POA: Diagnosis not present

## 2023-10-01 ENCOUNTER — Other Ambulatory Visit: Payer: Self-pay | Admitting: Family Medicine

## 2023-10-01 ENCOUNTER — Encounter (HOSPITAL_COMMUNITY): Payer: Self-pay

## 2023-10-02 ENCOUNTER — Other Ambulatory Visit (HOSPITAL_COMMUNITY): Payer: Self-pay

## 2023-10-03 ENCOUNTER — Other Ambulatory Visit: Payer: Self-pay

## 2023-10-03 ENCOUNTER — Other Ambulatory Visit (HOSPITAL_COMMUNITY): Payer: Self-pay

## 2023-10-03 MED ORDER — TRAZODONE HCL 50 MG PO TABS
50.0000 mg | ORAL_TABLET | Freq: Every day | ORAL | 0 refills | Status: AC
  Filled 2023-10-03: qty 135, 67d supply, fill #0

## 2023-10-17 ENCOUNTER — Other Ambulatory Visit: Payer: Self-pay | Admitting: Family Medicine

## 2023-10-17 DIAGNOSIS — Z1231 Encounter for screening mammogram for malignant neoplasm of breast: Secondary | ICD-10-CM

## 2023-12-12 ENCOUNTER — Ambulatory Visit
Admission: RE | Admit: 2023-12-12 | Discharge: 2023-12-12 | Disposition: A | Discharge status: Home / Self Care | Source: Ambulatory Visit | Attending: Family Medicine | Admitting: Family Medicine

## 2023-12-12 DIAGNOSIS — Z1231 Encounter for screening mammogram for malignant neoplasm of breast: Secondary | ICD-10-CM

## 2023-12-16 ENCOUNTER — Ambulatory Visit: Payer: Self-pay | Admitting: Family Medicine

## 2023-12-16 NOTE — Progress Notes (Signed)
 Please call patient. Normal mammogram.  Repeat in 1 year.

## 2024-02-05 ENCOUNTER — Ambulatory Visit: Admitting: Family Medicine

## 2024-02-26 ENCOUNTER — Ambulatory Visit: Admitting: Family Medicine

## 2024-08-26 ENCOUNTER — Encounter: Admitting: Family Medicine
